# Patient Record
Sex: Female | Born: 1945 | Marital: Married | Smoking: Never smoker
Health system: Southern US, Community
[De-identification: ages and names within clinical notes are randomized; demographics above are authoritative.]

## PROBLEM LIST (undated history)

## (undated) DIAGNOSIS — G5600 Carpal tunnel syndrome, unspecified upper limb: Secondary | ICD-10-CM

## (undated) DIAGNOSIS — E119 Type 2 diabetes mellitus without complications: Secondary | ICD-10-CM

## (undated) DIAGNOSIS — I10 Essential (primary) hypertension: Secondary | ICD-10-CM

## (undated) DIAGNOSIS — I872 Venous insufficiency (chronic) (peripheral): Secondary | ICD-10-CM

## (undated) DIAGNOSIS — E785 Hyperlipidemia, unspecified: Secondary | ICD-10-CM

## (undated) DIAGNOSIS — I5A Non-ischemic myocardial injury (non-traumatic): Secondary | ICD-10-CM

## (undated) DIAGNOSIS — M1990 Unspecified osteoarthritis, unspecified site: Secondary | ICD-10-CM

## (undated) DIAGNOSIS — E039 Hypothyroidism, unspecified: Secondary | ICD-10-CM

## (undated) HISTORY — PX: OTHER SURGICAL HISTORY: SHX169

---

## 2006-05-11 ENCOUNTER — Ambulatory Visit: Payer: Self-pay

## 2006-05-20 ENCOUNTER — Ambulatory Visit: Payer: Self-pay

## 2006-11-11 ENCOUNTER — Ambulatory Visit: Payer: Self-pay

## 2008-02-13 ENCOUNTER — Ambulatory Visit: Payer: Self-pay

## 2010-12-10 ENCOUNTER — Ambulatory Visit: Payer: Self-pay | Admitting: Family Medicine

## 2012-03-27 ENCOUNTER — Ambulatory Visit: Payer: Self-pay

## 2012-08-16 ENCOUNTER — Ambulatory Visit: Payer: Self-pay | Admitting: Family Medicine

## 2013-08-22 ENCOUNTER — Ambulatory Visit: Payer: Self-pay | Admitting: Family Medicine

## 2014-03-08 DIAGNOSIS — M7989 Other specified soft tissue disorders: Secondary | ICD-10-CM | POA: Insufficient documentation

## 2015-07-16 ENCOUNTER — Other Ambulatory Visit: Payer: Self-pay | Admitting: Family Medicine

## 2015-07-16 DIAGNOSIS — Z1231 Encounter for screening mammogram for malignant neoplasm of breast: Secondary | ICD-10-CM

## 2015-07-30 ENCOUNTER — Other Ambulatory Visit: Payer: Self-pay | Admitting: Family Medicine

## 2015-07-30 ENCOUNTER — Ambulatory Visit
Admission: RE | Admit: 2015-07-30 | Discharge: 2015-07-30 | Disposition: A | Discharge status: Home / Self Care | Payer: Self-pay | Source: Ambulatory Visit | Attending: Family Medicine | Admitting: Family Medicine

## 2015-07-30 DIAGNOSIS — Z1231 Encounter for screening mammogram for malignant neoplasm of breast: Secondary | ICD-10-CM

## 2016-06-28 ENCOUNTER — Other Ambulatory Visit: Payer: Self-pay | Admitting: Family Medicine

## 2016-06-28 DIAGNOSIS — Z1231 Encounter for screening mammogram for malignant neoplasm of breast: Secondary | ICD-10-CM

## 2016-08-03 ENCOUNTER — Ambulatory Visit
Admission: RE | Admit: 2016-08-03 | Discharge: 2016-08-03 | Disposition: A | Discharge status: Home / Self Care | Payer: Self-pay | Source: Ambulatory Visit | Attending: Family Medicine | Admitting: Family Medicine

## 2016-08-03 DIAGNOSIS — Z1231 Encounter for screening mammogram for malignant neoplasm of breast: Secondary | ICD-10-CM

## 2018-05-10 DIAGNOSIS — R29898 Other symptoms and signs involving the musculoskeletal system: Secondary | ICD-10-CM | POA: Insufficient documentation

## 2018-05-10 DIAGNOSIS — R200 Anesthesia of skin: Secondary | ICD-10-CM | POA: Insufficient documentation

## 2018-05-10 DIAGNOSIS — M79601 Pain in right arm: Secondary | ICD-10-CM | POA: Insufficient documentation

## 2019-05-04 DIAGNOSIS — M1710 Unilateral primary osteoarthritis, unspecified knee: Secondary | ICD-10-CM | POA: Insufficient documentation

## 2019-05-04 DIAGNOSIS — M179 Osteoarthritis of knee, unspecified: Secondary | ICD-10-CM | POA: Insufficient documentation

## 2019-05-04 DIAGNOSIS — M545 Low back pain, unspecified: Secondary | ICD-10-CM | POA: Insufficient documentation

## 2019-06-23 ENCOUNTER — Ambulatory Visit: Payer: Medicare Other | Attending: Internal Medicine

## 2019-06-23 DIAGNOSIS — Z23 Encounter for immunization: Secondary | ICD-10-CM

## 2019-06-23 NOTE — Progress Notes (Signed)
   Covid-19 Vaccination Clinic  Name:  Gigi Onstad    MRN: [id number] DOB: 05-20-1945  06/23/2019  Ms. Moser was observed post Covid-19 immunization for 15 minutes without incident. She was provided with Vaccine Information Sheet and instruction to access the V-Safe system.   Ms. Tinch was instructed to call 911 with any severe reactions post vaccine: Marland Kitchen Difficulty breathing  . Swelling of face and throat  . A fast heartbeat  . A bad rash all over body  . Dizziness and weakness   Immunizations Administered    Name Date Dose VIS Date Route   Pfizer COVID-19 Vaccine 06/23/2019  8:46 AM 0.3 mL 03/09/2019 Intramuscular   Manufacturer: [employer], [employer]   Lot: [id number]   NDC: [address]-3

## 2019-07-14 ENCOUNTER — Ambulatory Visit: Payer: Medicare Other | Attending: Internal Medicine

## 2019-07-14 DIAGNOSIS — Z23 Encounter for immunization: Secondary | ICD-10-CM

## 2019-07-14 NOTE — Progress Notes (Signed)
   Covid-19 Vaccination Clinic  Name:  Kelsey Wise    MRN: [id number] DOB: 1945-12-23  07/14/2019  [name] was observed post Covid-19 immunization for 15 minutes without incident. She was provided with Vaccine Information Sheet and instruction to access the V-Safe system.   [name] was instructed to call 911 with any severe reactions post vaccine: Marland Kitchen Difficulty breathing  . Swelling of face and throat  . A fast heartbeat  . A bad rash all over body  . Dizziness and weakness   Immunizations Administered    Name Date Dose VIS Date Route   Pfizer COVID-19 Vaccine 07/14/2019  8:20 AM 0.3 mL 03/09/2019 Intramuscular   Manufacturer: [employer], [employer]   Lot: [id number]   NDC: [address]-1

## 2019-09-11 ENCOUNTER — Other Ambulatory Visit: Payer: Self-pay | Admitting: Family Medicine

## 2019-09-11 DIAGNOSIS — Z1231 Encounter for screening mammogram for malignant neoplasm of breast: Secondary | ICD-10-CM

## 2020-10-27 ENCOUNTER — Other Ambulatory Visit: Payer: Self-pay | Admitting: Family Medicine

## 2020-10-27 DIAGNOSIS — Z1231 Encounter for screening mammogram for malignant neoplasm of breast: Secondary | ICD-10-CM

## 2020-12-08 ENCOUNTER — Ambulatory Visit
Admission: RE | Admit: 2020-12-08 | Discharge: 2020-12-08 | Disposition: A | Discharge status: Home / Self Care | Payer: Medicare Other | Source: Ambulatory Visit | Attending: Family Medicine | Admitting: Family Medicine

## 2020-12-08 ENCOUNTER — Other Ambulatory Visit: Payer: Self-pay

## 2020-12-08 DIAGNOSIS — Z1231 Encounter for screening mammogram for malignant neoplasm of breast: Secondary | ICD-10-CM | POA: Insufficient documentation

## 2020-12-12 ENCOUNTER — Other Ambulatory Visit (INDEPENDENT_AMBULATORY_CARE_PROVIDER_SITE_OTHER): Payer: Self-pay | Admitting: Vascular Surgery

## 2020-12-12 DIAGNOSIS — M79605 Pain in left leg: Secondary | ICD-10-CM

## 2020-12-15 ENCOUNTER — Other Ambulatory Visit: Payer: Self-pay

## 2020-12-15 ENCOUNTER — Ambulatory Visit (INDEPENDENT_AMBULATORY_CARE_PROVIDER_SITE_OTHER): Payer: Medicare Other | Admitting: Nurse Practitioner

## 2020-12-15 ENCOUNTER — Encounter (INDEPENDENT_AMBULATORY_CARE_PROVIDER_SITE_OTHER): Payer: Self-pay | Admitting: Nurse Practitioner

## 2020-12-15 ENCOUNTER — Ambulatory Visit (INDEPENDENT_AMBULATORY_CARE_PROVIDER_SITE_OTHER): Payer: Medicare Other

## 2020-12-15 VITALS — BP 142/78 | HR 99 | Resp 17 | Ht <= 58 in | Wt 216.0 lb

## 2020-12-15 DIAGNOSIS — M1711 Unilateral primary osteoarthritis, right knee: Secondary | ICD-10-CM | POA: Diagnosis not present

## 2020-12-15 DIAGNOSIS — M79605 Pain in left leg: Secondary | ICD-10-CM

## 2020-12-15 NOTE — Progress Notes (Signed)
Subjective:    Patient ID: Kelsey Wise, female    DOB: [birth year], 75 y.o.   MRN: [id number] Chief Complaint  Patient presents with   New Patient (Initial Visit)    NP referred  by Dr. Hillery Aldo LLE pain worse W/ Ambulation     Kelsey Wise is a 75 year old female that is referred by Dr. Allena Katz in regards to concern for possible peripheral arterial disease.  The patient has left lower extremity pain that tends to be worse when she is ambulating.  She notes that the pain radiates from her knee up to her hip.  She denies any rest pain like symptoms.  She denies any wounds or ulcerations.  The patient does have a history of significant arthritis in several tender areas including her knees and her shoulders.  Today noninvasive studies show an ABI of 1.14 on the right and 1.16 on the left.  The patient has a TBI 0.92 on the right and 0.88 on the left.  Patient has triphasic tibial artery waveforms bilaterally with good toe waveforms bilaterally.   Review of Systems  Musculoskeletal:  Positive for arthralgias and gait problem.  All other systems reviewed and are negative.     Objective:   Physical Exam Vitals reviewed.  HENT:     Head: Normocephalic.  Cardiovascular:     Rate and Rhythm: Normal rate.     Pulses:          Dorsalis pedis pulses are 2+ on the right side and 1+ on the left side.       Posterior tibial pulses are 1+ on the right side and 2+ on the left side.  Pulmonary:     Effort: Pulmonary effort is normal.  Musculoskeletal:     Right lower leg: 1+ Edema present.     Left lower leg: 1+ Edema present.  Skin:    Capillary Refill: Capillary refill takes less than 2 seconds.  Neurological:     Mental Status: She is alert and oriented to person, place, and time.     Gait: Gait abnormal.  Psychiatric:        Mood and Affect: Mood normal.        Behavior: Behavior normal.        Thought Content: Thought content normal.        Judgment: Judgment normal.    BP (!)  142/78   Pulse 99   Resp 17   Ht 4\' 10"  (1.473 m)   Wt 216 lb (98 kg)   BMI 45.14 kg/m   History reviewed. No pertinent past medical history.  Social History   Socioeconomic History   Marital status: Married    Spouse name: Not on file   Number of children: Not on file   Years of education: Not on file   Highest education level: Not on file  Occupational History   Not on file  Tobacco Use   Smoking status: Never   Smokeless tobacco: Never  Substance and Sexual Activity   Alcohol use: Not on file   Drug use: Not on file   Sexual activity: Not on file  Other Topics Concern   Not on file  Social History Narrative   Not on file   Social Determinants of Health   Financial Resource Strain: Not on file  Food Insecurity: Not on file  Transportation Needs: Not on file  Physical Activity: Not on file  Stress: Not on file  Social Connections: Not on file  Intimate Partner Violence: Not on file    History reviewed. No pertinent surgical history.  Family History  Problem Relation Age of Onset   Breast cancer Neg Hx     Not on File  No flowsheet data found.    CMP  No results found for: NA, K, CL, CO2, GLUCOSE, BUN, CREATININE, CALCIUM, PROT, ALBUMIN, AST, ALT, ALKPHOS, BILITOT, GFRNONAA, GFRAA   No results found.     Assessment & Plan:    1. Left leg pain Recommend:  I do not find evidence of Vascular pathology that would explain the patient's symptoms  The patient has atypical pain symptoms for vascular disease  I do not find evidence of Vascular pathology that would explain the patient's symptoms and I suspect the patient is c/o pseudoclaudication.  Patient should have an evaluation of his LS spine which I defer to the primary service.  Noninvasive studies including ABIs the legs do not identify vascular problems  The patient should continue walking and begin a more formal exercise program. The patient should continue his antiplatelet therapy and  aggressive treatment of the lipid abnormalities. The patient should begin wearing graduated compression socks 15-20 mmHg strength to control her mild edema.  Patient will follow-up with me on a PRN basis  Further work-up of her lower extremity pain is deferred to the primary service      2. Osteoarthritis of right knee, unspecified osteoarthritis type Continue NSAID medications as already ordered, these medications have been reviewed and there are no changes at this time.  Continued activity and therapy was stressed.    Current Outpatient Medications on File Prior to Visit  Medication Sig Dispense Refill   Multiple Vitamins-Minerals (MULTIVITAMIN WITH MINERALS) tablet Take 1 tablet by mouth daily.     acetaminophen (TYLENOL) 325 MG tablet Take by mouth.     aspirin 81 MG chewable tablet Chew 1 tablet by mouth daily.     atorvastatin (LIPITOR) 80 MG tablet Take 1 tablet by mouth daily.     buPROPion (WELLBUTRIN SR) 150 MG 12 hr tablet Take 1 tablet by mouth daily.     gabapentin (NEURONTIN) 600 MG tablet Take 1 tablet by mouth 2 (two) times daily.     Garlic 1000 MG CAPS Take 1 capsule by mouth daily.     levothyroxine (SYNTHROID) 50 MCG tablet Take 1 tablet by mouth daily.     loratadine (CLARITIN) 10 MG tablet Take 1 tablet by mouth daily.     meloxicam (MOBIC) 15 MG tablet Take 15 mg by mouth daily.     olopatadine (PATANOL) 0.1 % ophthalmic solution SMARTSIG:In Eye(s)     triamterene-hydrochlorothiazide (MAXZIDE-25) 37.5-25 MG tablet Take 1 tablet by mouth daily.     No current facility-administered medications on file prior to visit.    There are no Patient Instructions on file for this visit. No follow-ups on file.   Georgiana Spinner, NP

## 2021-11-03 ENCOUNTER — Other Ambulatory Visit: Payer: Self-pay | Admitting: Family Medicine

## 2021-11-03 DIAGNOSIS — Z1231 Encounter for screening mammogram for malignant neoplasm of breast: Secondary | ICD-10-CM

## 2021-12-09 ENCOUNTER — Ambulatory Visit
Admission: RE | Admit: 2021-12-09 | Discharge: 2021-12-09 | Disposition: A | Discharge status: Home / Self Care | Payer: Medicare Other | Source: Ambulatory Visit | Attending: Family Medicine | Admitting: Family Medicine

## 2021-12-09 DIAGNOSIS — Z1231 Encounter for screening mammogram for malignant neoplasm of breast: Secondary | ICD-10-CM | POA: Insufficient documentation

## 2022-11-23 IMAGING — MG MM DIGITAL SCREENING BILAT W/ TOMO AND CAD
6 of 12 series · 6 of 36 positions shown · non-contrast
Comparison: Previous exam(s).

CLINICAL DATA: Screening.

EXAM:
DIGITAL SCREENING BILATERAL MAMMOGRAM WITH TOMOSYNTHESIS AND CAD
TECHNIQUE: Bilateral screening digital craniocaudal and mediolateral oblique
mammograms were obtained. Bilateral screening digital breast
tomosynthesis was performed. The images were evaluated with
computer-aided detection.

[L MLO synth-2D (1 of 2)]
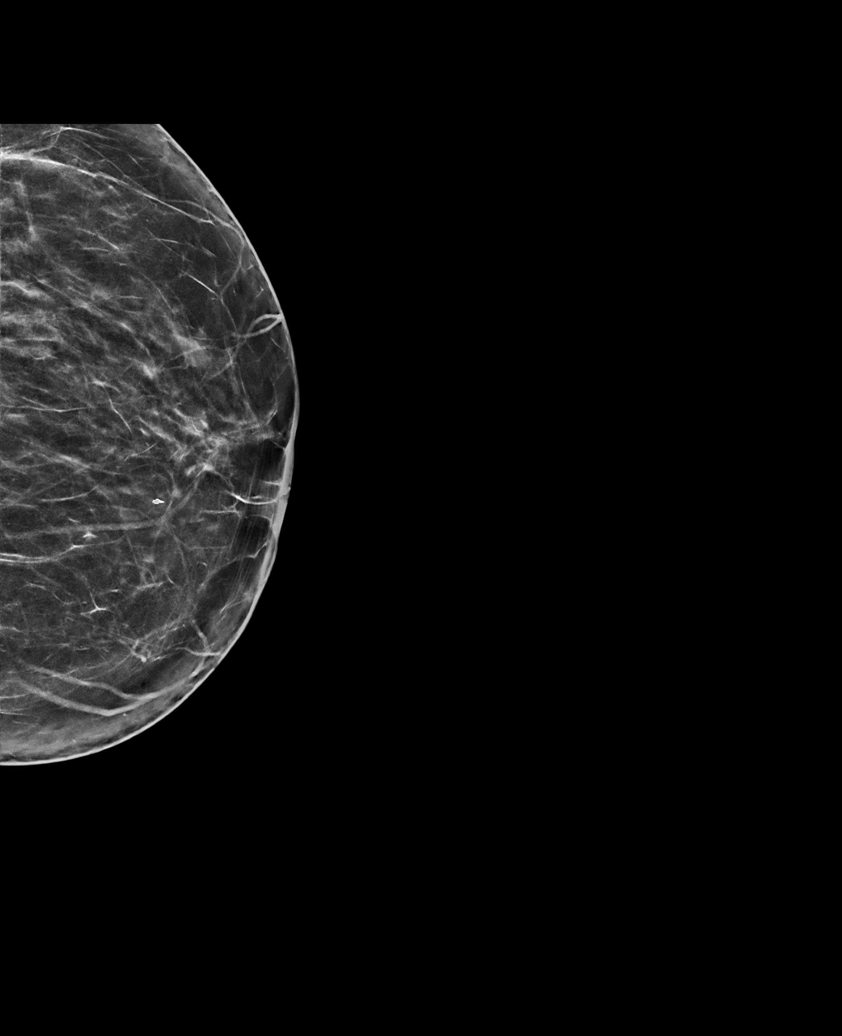

[L MLO synth-2D (2 of 2)]
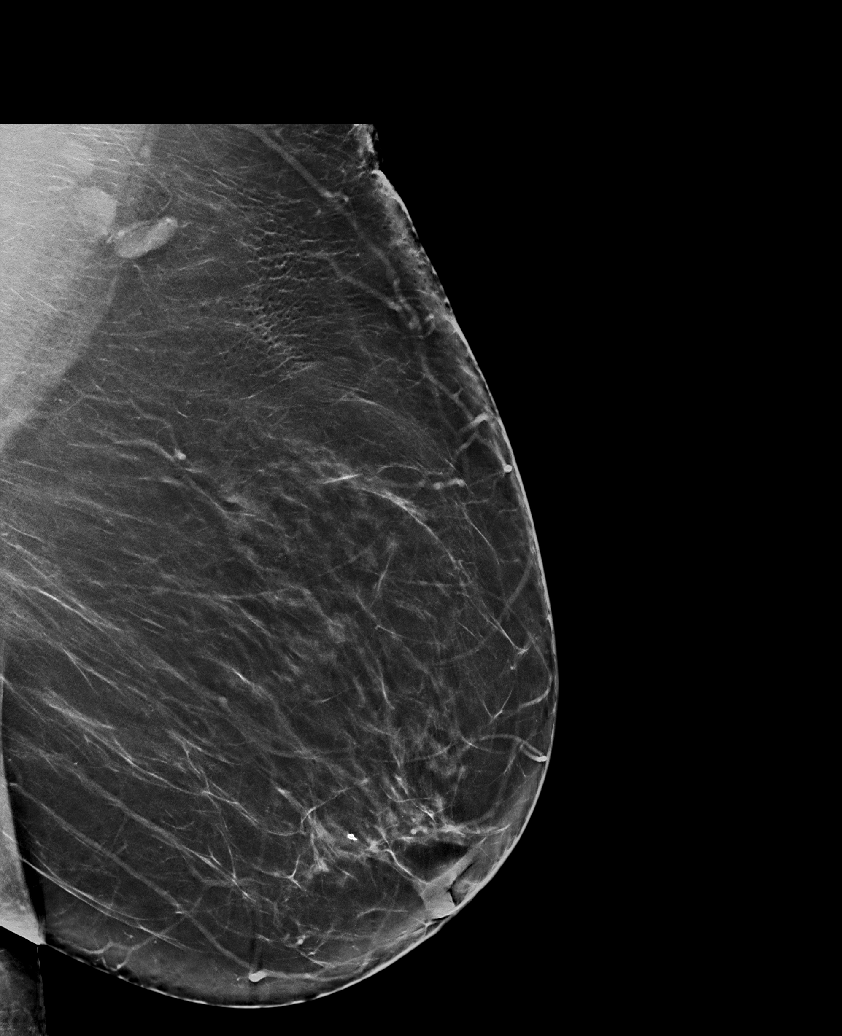

[L CC synth-2D]
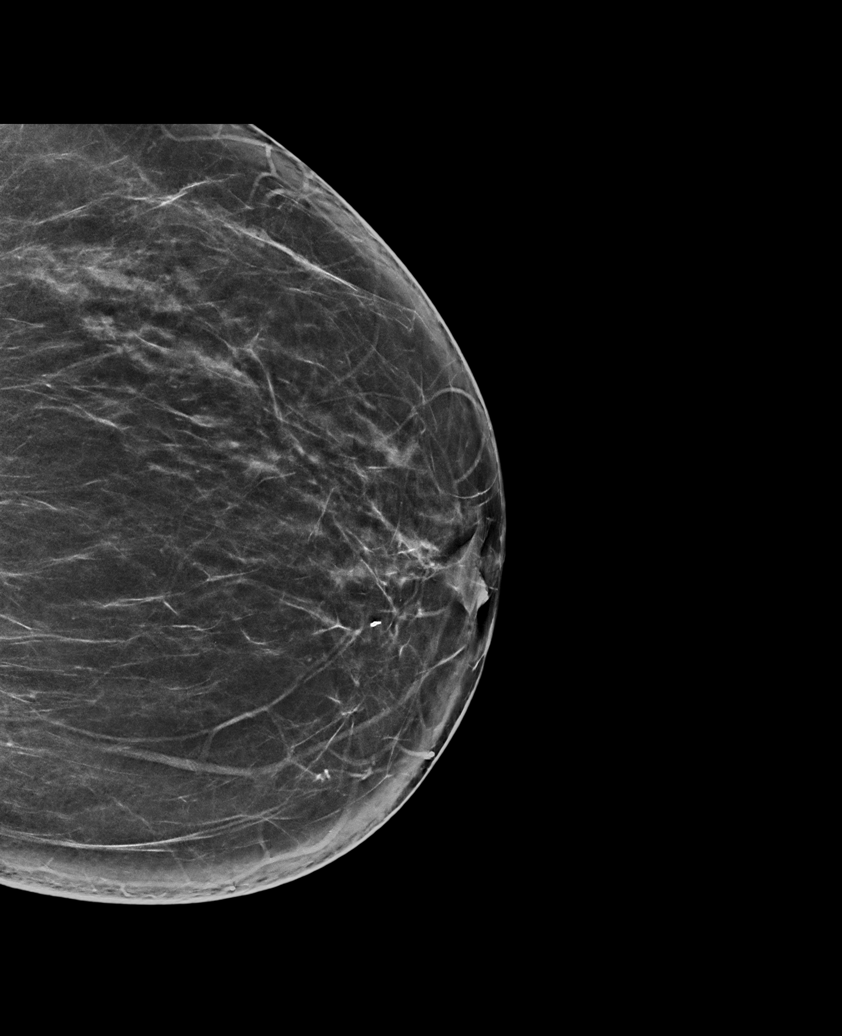

[R CC synth-2D]
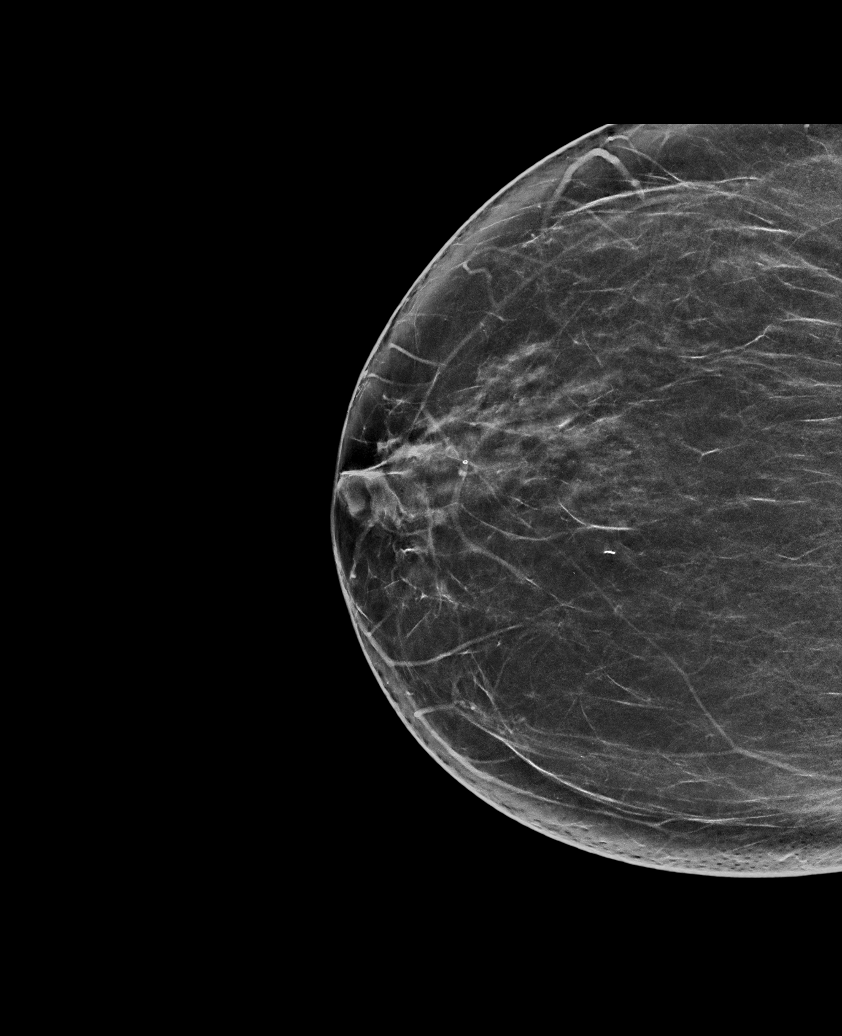

[R MLO synth-2D (1 of 2)]
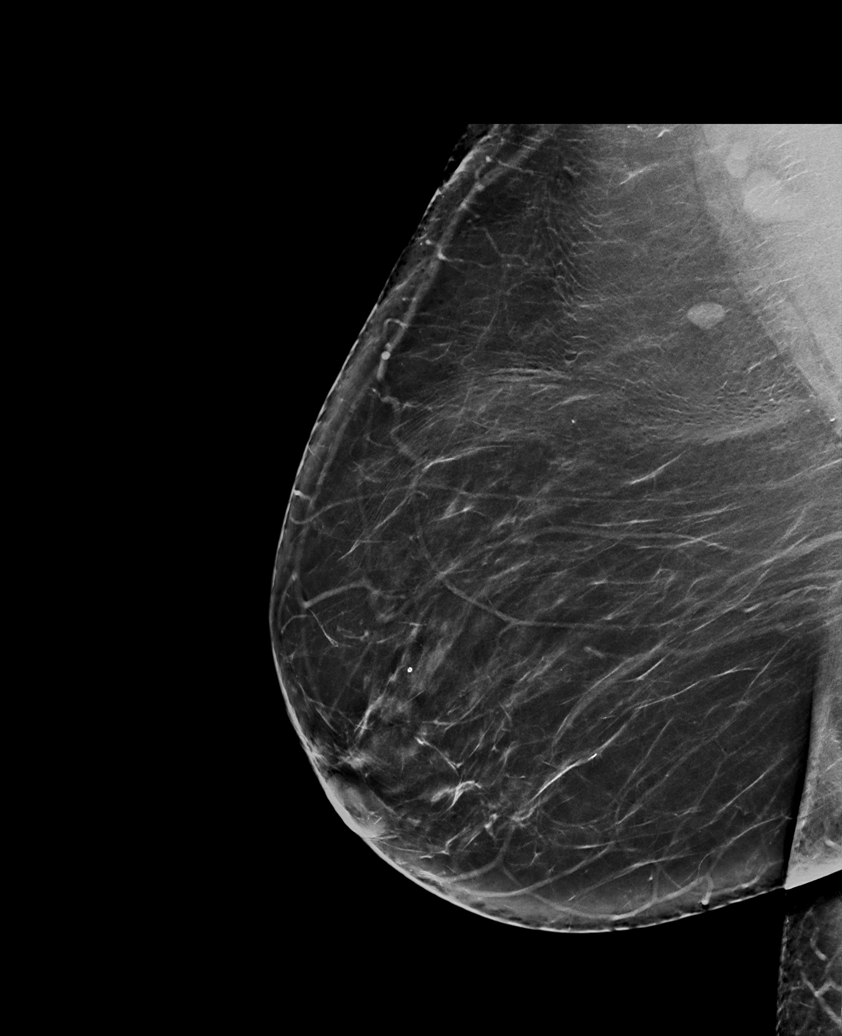

[R MLO synth-2D (2 of 2)]
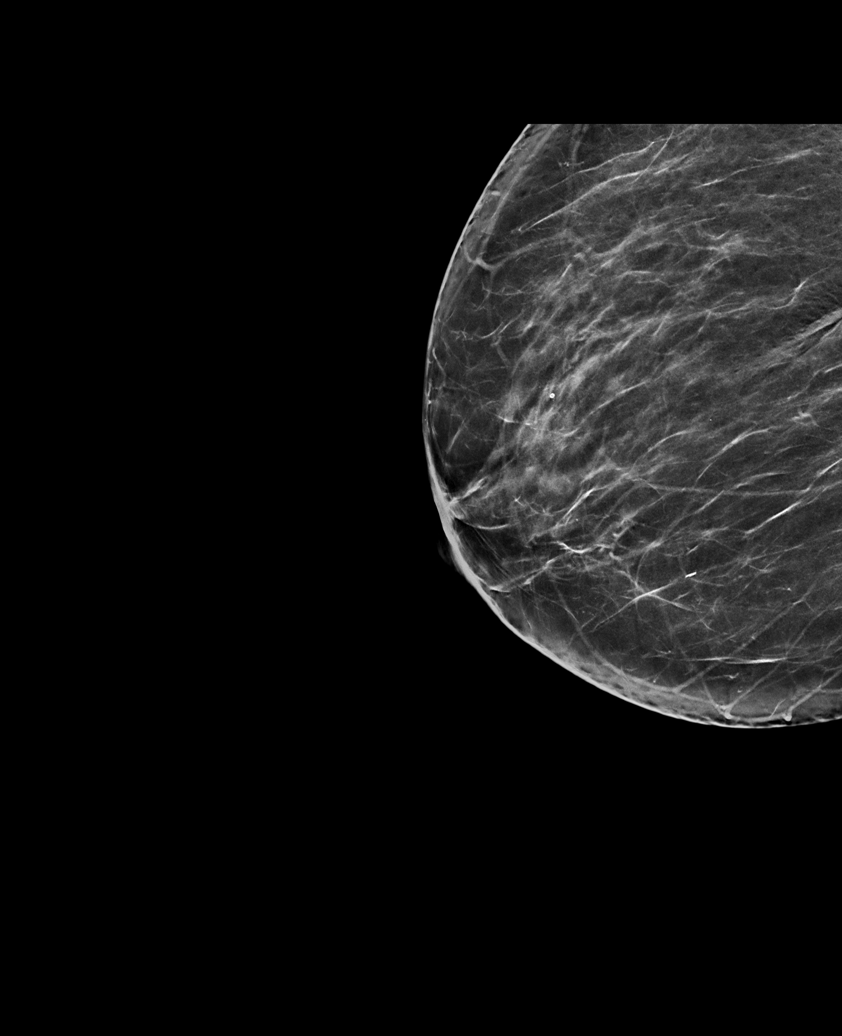

[6 of 36 positions shown; findings below may reference images not displayed]

ACR Breast Density Category b: There are scattered areas of
fibroglandular density.
FINDINGS: There are no findings suspicious for malignancy.
IMPRESSION: No mammographic evidence of malignancy. A result letter of this
screening mammogram will be mailed directly to the patient.

RECOMMENDATION:
Screening mammogram in one year. (Code:51-O-LD2)

BI-RADS CATEGORY  1: Negative.

## 2022-12-01 ENCOUNTER — Encounter: Payer: Self-pay | Admitting: Family Medicine

## 2023-01-11 ENCOUNTER — Encounter: Payer: Self-pay | Admitting: Obstetrics & Gynecology

## 2023-01-31 ENCOUNTER — Encounter: Payer: Self-pay | Admitting: Obstetrics & Gynecology

## 2023-01-31 ENCOUNTER — Ambulatory Visit (INDEPENDENT_AMBULATORY_CARE_PROVIDER_SITE_OTHER): Payer: Medicare Other | Admitting: Obstetrics & Gynecology

## 2023-01-31 VITALS — BP 117/77 | HR 77 | Ht <= 58 in | Wt 200.0 lb

## 2023-01-31 DIAGNOSIS — Z4689 Encounter for fitting and adjustment of other specified devices: Secondary | ICD-10-CM | POA: Diagnosis not present

## 2023-01-31 DIAGNOSIS — N814 Uterovaginal prolapse, unspecified: Secondary | ICD-10-CM

## 2023-01-31 DIAGNOSIS — Z758 Other problems related to medical facilities and other health care: Secondary | ICD-10-CM

## 2023-01-31 DIAGNOSIS — Z603 Acculturation difficulty: Secondary | ICD-10-CM | POA: Diagnosis not present

## 2023-01-31 DIAGNOSIS — Z7689 Persons encountering health services in other specified circumstances: Secondary | ICD-10-CM

## 2023-01-31 NOTE — Progress Notes (Signed)
    GYNECOLOGY PROGRESS NOTE  Subjective:    Patient ID: Kelsey Wise, female    DOB: 12-18-1945, 77 y.o.   MRN: [id number]  HPI  Patient is a 77 y.o. married Gre[place]-speaking [phone] (4 living children) here as a new patient with the issue of "my uterus dropping" for about 3-5 months.   The following portions of the patient's history were reviewed and updated as appropriate: allergies, current medications, past family history, past medical history, past social history, past surgical history, and problem list.  Review of Systems Pertinent items are noted in HPI.   Objective:   Blood pressure 117/77, pulse 77, height 4\' 10"  (1.473 m), weight 200 lb (90.7 kg). Body mass index is 41.8 kg/m. [place] interpretor via iPad present for exam  General appearance: alert Complete procedentia noted, moderate VVA Bimanual exam reveals no masses or pain I fitted tried several pessaries and eventually found that a #3 ring with support relieves her prolapse and does not cause her pain/discomfort.   Assessment:   1. Establishing care with new doctor, encounter for   2. Uterine prolapse   3. Language barrier      Plan:   1. Establishing care with new doctor, encounter for   2. Uterine prolapse - I will have a #3 ring with support ordered. When it arrives, her English-speaking son (also here today with her) will be notified and she will return for its placement.  3. Language barrier

## 2023-02-21 ENCOUNTER — Ambulatory Visit: Payer: Medicare Other | Admitting: Obstetrics & Gynecology

## 2023-02-21 ENCOUNTER — Encounter: Payer: Self-pay | Admitting: Obstetrics & Gynecology

## 2023-02-21 VITALS — BP 134/78 | HR 91 | Wt 195.4 lb

## 2023-02-21 DIAGNOSIS — Z1231 Encounter for screening mammogram for malignant neoplasm of breast: Secondary | ICD-10-CM

## 2023-02-21 DIAGNOSIS — N813 Complete uterovaginal prolapse: Secondary | ICD-10-CM | POA: Diagnosis not present

## 2023-02-21 DIAGNOSIS — N814 Uterovaginal prolapse, unspecified: Secondary | ICD-10-CM

## 2023-02-21 NOTE — Progress Notes (Signed)
    GYNECOLOGY PROGRESS NOTE  Subjective:    Patient ID: Kelsey Wise, female    DOB: [birth year], 77 y.o.   MRN: [id number]  HPI  Patient is a 77 y.o. married [id number] Greek-speaking lady here to have her new #3 ring pessary placed. I met her 3 weeks ago with the issue to POP.  She has complete procedentia.  The following portions of the patient's history were reviewed and updated as appropriate: allergies, current medications, past family history, past medical history, past social history, past surgical history, and problem list.  Review of Systems Pertinent items are noted in HPI.   Objective:  Well nourished, well hydrated White female, no apparent distress Video interpretor used for communication during this visit. She is ambulating and conversing normally. It works best for her to use the electric bed. I placed the new #3 ring with support in her vagina. It held her uterus up and did not cause her any discomfort initially. However, after she dressed and I left the room, she called me back because she was feeling it "low". I removed it.     Assessment:   Complete uterine prolapse Screening for breast cancer  Plan:   Will refer her to urogynecology for further management

## 2023-02-27 DIAGNOSIS — R6520 Severe sepsis without septic shock: Secondary | ICD-10-CM

## 2023-02-27 DIAGNOSIS — N179 Acute kidney failure, unspecified: Secondary | ICD-10-CM

## 2023-02-27 DIAGNOSIS — A419 Sepsis, unspecified organism: Secondary | ICD-10-CM

## 2023-02-27 HISTORY — DX: Severe sepsis without septic shock: R65.20

## 2023-02-27 HISTORY — DX: Acute kidney failure, unspecified: N17.9

## 2023-02-27 HISTORY — DX: Sepsis, unspecified organism: A41.9

## 2023-03-13 ENCOUNTER — Emergency Department: Payer: Medicare Other

## 2023-03-13 ENCOUNTER — Inpatient Hospital Stay
Admission: EM | Admit: 2023-03-13 | Discharge: 2023-03-17 | DRG: 854 | Disposition: A | Discharge status: Home / Self Care | Payer: Medicare Other | Attending: Internal Medicine | Admitting: Internal Medicine

## 2023-03-13 ENCOUNTER — Other Ambulatory Visit: Payer: Self-pay

## 2023-03-13 DIAGNOSIS — N136 Pyonephrosis: Secondary | ICD-10-CM | POA: Diagnosis present

## 2023-03-13 DIAGNOSIS — Z6841 Body Mass Index (BMI) 40.0 and over, adult: Secondary | ICD-10-CM

## 2023-03-13 DIAGNOSIS — Z791 Long term (current) use of non-steroidal anti-inflammatories (NSAID): Secondary | ICD-10-CM | POA: Diagnosis not present

## 2023-03-13 DIAGNOSIS — Z79899 Other long term (current) drug therapy: Secondary | ICD-10-CM

## 2023-03-13 DIAGNOSIS — A419 Sepsis, unspecified organism: Secondary | ICD-10-CM | POA: Diagnosis not present

## 2023-03-13 DIAGNOSIS — I10 Essential (primary) hypertension: Secondary | ICD-10-CM | POA: Diagnosis present

## 2023-03-13 DIAGNOSIS — I2489 Other forms of acute ischemic heart disease: Secondary | ICD-10-CM | POA: Diagnosis present

## 2023-03-13 DIAGNOSIS — E039 Hypothyroidism, unspecified: Secondary | ICD-10-CM | POA: Diagnosis present

## 2023-03-13 DIAGNOSIS — N3000 Acute cystitis without hematuria: Secondary | ICD-10-CM | POA: Diagnosis not present

## 2023-03-13 DIAGNOSIS — G5600 Carpal tunnel syndrome, unspecified upper limb: Secondary | ICD-10-CM | POA: Insufficient documentation

## 2023-03-13 DIAGNOSIS — Z79890 Hormone replacement therapy (postmenopausal): Secondary | ICD-10-CM | POA: Diagnosis not present

## 2023-03-13 DIAGNOSIS — N179 Acute kidney failure, unspecified: Secondary | ICD-10-CM | POA: Diagnosis present

## 2023-03-13 DIAGNOSIS — E785 Hyperlipidemia, unspecified: Secondary | ICD-10-CM | POA: Diagnosis present

## 2023-03-13 DIAGNOSIS — E11649 Type 2 diabetes mellitus with hypoglycemia without coma: Secondary | ICD-10-CM | POA: Diagnosis not present

## 2023-03-13 DIAGNOSIS — D696 Thrombocytopenia, unspecified: Secondary | ICD-10-CM | POA: Diagnosis present

## 2023-03-13 DIAGNOSIS — Z7982 Long term (current) use of aspirin: Secondary | ICD-10-CM

## 2023-03-13 DIAGNOSIS — Z7984 Long term (current) use of oral hypoglycemic drugs: Secondary | ICD-10-CM

## 2023-03-13 DIAGNOSIS — Z603 Acculturation difficulty: Secondary | ICD-10-CM | POA: Diagnosis present

## 2023-03-13 DIAGNOSIS — B964 Proteus (mirabilis) (morganii) as the cause of diseases classified elsewhere: Secondary | ICD-10-CM | POA: Diagnosis present

## 2023-03-13 DIAGNOSIS — A4159 Other Gram-negative sepsis: Principal | ICD-10-CM | POA: Diagnosis present

## 2023-03-13 DIAGNOSIS — Z8249 Family history of ischemic heart disease and other diseases of the circulatory system: Secondary | ICD-10-CM

## 2023-03-13 DIAGNOSIS — E1165 Type 2 diabetes mellitus with hyperglycemia: Secondary | ICD-10-CM | POA: Diagnosis present

## 2023-03-13 DIAGNOSIS — R7989 Other specified abnormal findings of blood chemistry: Secondary | ICD-10-CM | POA: Diagnosis present

## 2023-03-13 DIAGNOSIS — N390 Urinary tract infection, site not specified: Secondary | ICD-10-CM | POA: Diagnosis present

## 2023-03-13 DIAGNOSIS — E876 Hypokalemia: Secondary | ICD-10-CM | POA: Diagnosis present

## 2023-03-13 DIAGNOSIS — I5A Non-ischemic myocardial injury (non-traumatic): Secondary | ICD-10-CM | POA: Diagnosis present

## 2023-03-13 DIAGNOSIS — R531 Weakness: Secondary | ICD-10-CM

## 2023-03-13 DIAGNOSIS — R7881 Bacteremia: Secondary | ICD-10-CM | POA: Diagnosis present

## 2023-03-13 DIAGNOSIS — R6520 Severe sepsis without septic shock: Secondary | ICD-10-CM | POA: Diagnosis present

## 2023-03-13 DIAGNOSIS — E871 Hypo-osmolality and hyponatremia: Secondary | ICD-10-CM | POA: Diagnosis present

## 2023-03-13 DIAGNOSIS — R7303 Prediabetes: Secondary | ICD-10-CM | POA: Diagnosis present

## 2023-03-13 DIAGNOSIS — I872 Venous insufficiency (chronic) (peripheral): Secondary | ICD-10-CM | POA: Insufficient documentation

## 2023-03-13 HISTORY — DX: Carpal tunnel syndrome, unspecified upper limb: G56.00

## 2023-03-13 HISTORY — DX: Hyperlipidemia, unspecified: E78.5

## 2023-03-13 HISTORY — DX: Venous insufficiency (chronic) (peripheral): I87.2

## 2023-03-13 HISTORY — DX: Hypothyroidism, unspecified: E03.9

## 2023-03-13 LAB — CBC
HCT: 39.9 % (ref 36.0–46.0)
Hemoglobin: 13.3 g/dL (ref 12.0–15.0)
MCH: 29.8 pg (ref 26.0–34.0)
MCHC: 33.3 g/dL (ref 30.0–36.0)
MCV: 89.5 fL (ref 80.0–100.0)
Platelets: 141 10*3/uL — ABNORMAL LOW (ref 150–400)
RBC: 4.46 MIL/uL (ref 3.87–5.11)
RDW: 13.4 % (ref 11.5–15.5)
WBC: 19.9 10*3/uL — ABNORMAL HIGH (ref 4.0–10.5)
nRBC: 0 % (ref 0.0–0.2)

## 2023-03-13 LAB — COMPREHENSIVE METABOLIC PANEL
ALT: 36 U/L (ref 0–44)
AST: 49 U/L — ABNORMAL HIGH (ref 15–41)
Albumin: 3.2 g/dL — ABNORMAL LOW (ref 3.5–5.0)
Alkaline Phosphatase: 140 U/L — ABNORMAL HIGH (ref 38–126)
Anion gap: 15 (ref 5–15)
BUN: 65 mg/dL — ABNORMAL HIGH (ref 8–23)
CO2: 23 mmol/L (ref 22–32)
Calcium: 8.4 mg/dL — ABNORMAL LOW (ref 8.9–10.3)
Chloride: 92 mmol/L — ABNORMAL LOW (ref 98–111)
Creatinine, Ser: 1.94 mg/dL — ABNORMAL HIGH (ref 0.44–1.00)
GFR, Estimated: 26 mL/min — ABNORMAL LOW (ref >60)
Glucose, Bld: 370 mg/dL — ABNORMAL HIGH (ref 70–99)
Potassium: 3.3 mmol/L — ABNORMAL LOW (ref 3.5–5.1)
Sodium: 130 mmol/L — ABNORMAL LOW (ref 135–145)
Total Bilirubin: 1.9 mg/dL — ABNORMAL HIGH (ref <1.2)
Total Protein: 7.6 g/dL (ref 6.5–8.1)

## 2023-03-13 LAB — URINALYSIS, ROUTINE W REFLEX MICROSCOPIC
Bacteria, UA: NONE SEEN
Bilirubin Urine: NEGATIVE
Glucose, UA: >=500 mg/dL — AB
Ketones, ur: NEGATIVE mg/dL
Nitrite: NEGATIVE
Protein, ur: 100 mg/dL — AB
Specific Gravity, Urine: 1.018 (ref 1.005–1.030)
Squamous Epithelial / HPF: 0 /[HPF] (ref 0–5)
WBC, UA: >50 WBC/hpf (ref 0–5)
pH: 8 (ref 5.0–8.0)

## 2023-03-13 LAB — BLOOD GAS, VENOUS
Acid-Base Excess: 0.3 mmol/L (ref 0.0–2.0)
Bicarbonate: 24.6 mmol/L (ref 20.0–28.0)
O2 Saturation: 59.4 %
Patient temperature: 37
pCO2, Ven: 38 mm[Hg] — ABNORMAL LOW (ref 44–60)
pH, Ven: 7.42 (ref 7.25–7.43)
pO2, Ven: 35 mm[Hg] (ref 32–45)

## 2023-03-13 LAB — PROTIME-INR
INR: 1.2 (ref 0.8–1.2)
Prothrombin Time: 15.2 s (ref 11.4–15.2)

## 2023-03-13 LAB — LACTIC ACID, PLASMA: Lactic Acid, Venous: 3.5 mmol/L (ref 0.5–1.9)

## 2023-03-13 LAB — CBG MONITORING, ED: Glucose-Capillary: 321 mg/dL — ABNORMAL HIGH (ref 70–99)

## 2023-03-13 LAB — TROPONIN I (HIGH SENSITIVITY): Troponin I (High Sensitivity): 127 ng/L (ref <18)

## 2023-03-13 LAB — MAGNESIUM: Magnesium: 2.1 mg/dL (ref 1.7–2.4)

## 2023-03-13 LAB — APTT: aPTT: 36 s (ref 24–36)

## 2023-03-13 MED ORDER — HEPARIN SODIUM (PORCINE) 5000 UNIT/ML IJ SOLN
5000.0000 [IU] | Freq: Three times a day (TID) | INTRAMUSCULAR | Status: DC
  Administered 2023-03-14 – 2023-03-17 (×11): 5000 [IU] via SUBCUTANEOUS
  Filled 2023-03-13 (×10): qty 1

## 2023-03-13 MED ORDER — SODIUM CHLORIDE 0.9 % IV SOLN
INTRAVENOUS | Status: AC
Start: 2023-03-13 — End: 2023-03-14

## 2023-03-13 MED ORDER — INSULIN ASPART 100 UNIT/ML IJ SOLN
0.0000 [IU] | Freq: Every day | INTRAMUSCULAR | Status: DC
  Administered 2023-03-14: 3 [IU] via SUBCUTANEOUS
  Administered 2023-03-14: 2 [IU] via SUBCUTANEOUS
  Filled 2023-03-13 (×2): qty 1

## 2023-03-13 MED ORDER — HYDRALAZINE HCL 20 MG/ML IJ SOLN: 5.0000 mg | INTRAMUSCULAR | Status: DC | PRN

## 2023-03-13 MED ORDER — ASPIRIN 81 MG PO TBEC
81.0000 mg | DELAYED_RELEASE_TABLET | Freq: Every day | ORAL | Status: DC
  Administered 2023-03-14 – 2023-03-17 (×5): 81 mg via ORAL
  Filled 2023-03-13 (×5): qty 1

## 2023-03-13 MED ORDER — ACETAMINOPHEN 325 MG PO TABS
325.0000 mg | ORAL_TABLET | Freq: Four times a day (QID) | ORAL | Status: DC | PRN
  Filled 2023-03-13: qty 1

## 2023-03-13 MED ORDER — SODIUM CHLORIDE 0.9 % IV BOLUS
1000.0000 mL | Freq: Once | INTRAVENOUS | Status: AC
  Administered 2023-03-14: 1000 mL via INTRAVENOUS

## 2023-03-13 MED ORDER — METRONIDAZOLE 500 MG/100ML IV SOLN
500.0000 mg | Freq: Once | INTRAVENOUS | Status: AC
  Administered 2023-03-13: 500 mg via INTRAVENOUS
  Filled 2023-03-13: qty 100

## 2023-03-13 MED ORDER — ONDANSETRON HCL 4 MG/2ML IJ SOLN: 4.0000 mg | Freq: Three times a day (TID) | INTRAMUSCULAR | Status: DC | PRN

## 2023-03-13 MED ORDER — SODIUM CHLORIDE 0.9 % IV SOLN
2.0000 g | Freq: Once | INTRAVENOUS | Status: AC
  Administered 2023-03-13: 2 g via INTRAVENOUS
  Filled 2023-03-13: qty 12.5

## 2023-03-13 MED ORDER — INSULIN ASPART 100 UNIT/ML IJ SOLN
0.0000 [IU] | Freq: Three times a day (TID) | INTRAMUSCULAR | Status: DC
  Administered 2023-03-14 – 2023-03-15 (×4): 2 [IU] via SUBCUTANEOUS
  Administered 2023-03-16: 1 [IU] via SUBCUTANEOUS
  Administered 2023-03-17: 3 [IU] via SUBCUTANEOUS
  Administered 2023-03-17: 1 [IU] via SUBCUTANEOUS
  Filled 2023-03-13 (×8): qty 1

## 2023-03-13 MED ORDER — LACTATED RINGERS IV BOLUS
1000.0000 mL | Freq: Once | INTRAVENOUS | Status: AC
  Administered 2023-03-13: 1000 mL via INTRAVENOUS

## 2023-03-13 MED ORDER — POTASSIUM CHLORIDE CRYS ER 20 MEQ PO TBCR
40.0000 meq | EXTENDED_RELEASE_TABLET | Freq: Once | ORAL | Status: AC
  Administered 2023-03-14: 40 meq via ORAL
  Filled 2023-03-13: qty 2

## 2023-03-13 MED ORDER — VANCOMYCIN HCL IN DEXTROSE 1-5 GM/200ML-% IV SOLN
1000.0000 mg | Freq: Once | INTRAVENOUS | Status: AC
Start: 2023-03-13 — End: 2023-03-14
  Administered 2023-03-13: 1000 mg via INTRAVENOUS
  Filled 2023-03-13: qty 200

## 2023-03-13 MED ORDER — INSULIN GLARGINE-YFGN 100 UNIT/ML ~~LOC~~ SOLN
10.0000 [IU] | Freq: Every day | SUBCUTANEOUS | Status: DC
  Administered 2023-03-14 – 2023-03-16 (×4): 10 [IU] via SUBCUTANEOUS
  Filled 2023-03-13 (×4): qty 0.1

## 2023-03-13 MED ORDER — ASPIRIN 81 MG PO TBEC: 81.0000 mg | DELAYED_RELEASE_TABLET | Freq: Every day | ORAL | Status: DC

## 2023-03-13 NOTE — ED Provider Notes (Signed)
[facility] Provider Note    Event Date/Time   First MD Initiated Contact with Patient 03/13/23 2035     (approximate)   History   Chief Complaint Hyperglycemia   HPI  Kelsey Wise is a 77 y.o. female with past medical history of hyperlipidemia, diabetes, and hypothyroidism who presents to the ED complaining of hyperglycemia.  Majority of history is obtained from son at bedside given patient speaks only [place].  He states that the patient has been weak, fatigued, and generally ill for the past 2 days.  She has had subjective fevers and chills, but they have not taken her temperature at home.  Patient denies any cough, chest pain, shortness of breath, nausea, vomiting, diarrhea, abdominal pain, or dysuria.  They have been checking her blood sugar at home and have found it to be in the 200s or 300s despite her being compliant with her diabetic medications.  Family denies any history of DKA.     Physical Exam   Triage Vital Signs: ED Triage Vitals  Encounter Vitals Group     BP 03/13/23 2016 (!) 108/54     Systolic BP Percentile --      Diastolic BP Percentile --      Pulse Rate 03/13/23 2016 (!) 130     Resp 03/13/23 2016 20     Temp 03/13/23 2016 97.9 F (36.6 C)     Temp Source 03/13/23 2016 Oral     SpO2 03/13/23 2016 94 %     Weight --      Height --      Head Circumference --      Peak Flow --      Pain Score 03/13/23 2014 0     Pain Loc --      Pain Education --      Exclude from Growth Chart --     Most recent vital signs: Vitals:   03/13/23 2200 03/13/23 2300  BP: 119/67 113/60  Pulse: 94 87  Resp: 20 (!) 22  Temp:    SpO2: 100% 100%    Constitutional: Alert and oriented. Eyes: Conjunctivae are normal. Head: Atraumatic. Nose: No congestion/rhinnorhea. Mouth/Throat: Mucous membranes are dry. Cardiovascular: Tachycardic, regular rhythm. Grossly normal heart sounds.  2+ radial pulses bilaterally. Respiratory: Normal respiratory  effort.  No retractions. Lungs CTAB. Gastrointestinal: Soft and nontender. No distention. Musculoskeletal: No lower extremity tenderness nor edema.  Neurologic:  Normal speech and language. No gross focal neurologic deficits are appreciated.    ED Results / Procedures / Treatments   Labs (all labs ordered are listed, but only abnormal results are displayed) Labs Reviewed  CBC - Abnormal; Notable for the following components:      Result Value   WBC 19.9 (*)    Platelets 141 (*)    All other components within normal limits  COMPREHENSIVE METABOLIC PANEL - Abnormal; Notable for the following components:   Sodium 130 (*)    Potassium 3.3 (*)    Chloride 92 (*)    Glucose, Bld 370 (*)    BUN 65 (*)    Creatinine, Ser 1.94 (*)    Calcium 8.4 (*)    Albumin 3.2 (*)    AST 49 (*)    Alkaline Phosphatase 140 (*)    Total Bilirubin 1.9 (*)    GFR, Estimated 26 (*)    All other components within normal limits  LACTIC ACID, PLASMA - Abnormal; Notable for the following components:   Lactic  Acid, Venous 3.5 (*)    All other components within normal limits  BLOOD GAS, VENOUS - Abnormal; Notable for the following components:   pCO2, Ven 38 (*)    All other components within normal limits  CBG MONITORING, ED - Abnormal; Notable for the following components:   Glucose-Capillary 321 (*)    All other components within normal limits  TROPONIN I (HIGH SENSITIVITY) - Abnormal; Notable for the following components:   Troponin I (High Sensitivity) 127 (*)    All other components within normal limits  CULTURE, BLOOD (ROUTINE X 2)  CULTURE, BLOOD (ROUTINE X 2)  MAGNESIUM  URINALYSIS, ROUTINE W REFLEX MICROSCOPIC  LACTIC ACID, PLASMA  HEMOGLOBIN A1C  LIPID PANEL  PROCALCITONIN  PHOSPHORUS  COMPREHENSIVE METABOLIC PANEL  CBC  APTT  PROTIME-INR  CBG MONITORING, ED  CBG MONITORING, ED  TROPONIN I (HIGH SENSITIVITY)     EKG  ED ECG REPORT I, Chesley Noon, the attending physician,  personally viewed and interpreted this ECG.   Date: 03/13/2023  EKG Time: 20:20  Rate: 128  Rhythm: sinus tachycardia  Axis: LAD  Intervals:none  ST&T Change: None  RADIOLOGY Chest x-ray reviewed and interpreted by me with no infiltrate, edema, or effusion.  PROCEDURES:  Critical Care performed: Yes, see critical care procedure note(s)  .Critical Care  Performed by: Chesley Noon, MD Authorized by: Chesley Noon, MD   Critical care provider statement:    Critical care time (minutes):  30   Critical care time was exclusive of:  Separately billable procedures and treating other patients and teaching time   Critical care was necessary to treat or prevent imminent or life-threatening deterioration of the following conditions:  Sepsis   Critical care was time spent personally by me on the following activities:  Development of treatment plan with patient or surrogate, discussions with consultants, evaluation of patient's response to treatment, examination of patient, ordering and review of laboratory studies, ordering and review of radiographic studies, ordering and performing treatments and interventions, pulse oximetry, re-evaluation of patient's condition and review of old charts   I assumed direction of critical care for this patient from another provider in my specialty: no     Care discussed with: admitting provider      MEDICATIONS ORDERED IN ED: Medications  vancomycin (VANCOCIN) IVPB 1000 mg/200 mL premix (has no administration in time range)  potassium chloride SA (KLOR-CON M) CR tablet 40 mEq (has no administration in time range)  ondansetron (ZOFRAN) injection 4 mg (has no administration in time range)  hydrALAZINE (APRESOLINE) injection 5 mg (has no administration in time range)  acetaminophen (TYLENOL) tablet 325 mg (has no administration in time range)  insulin aspart (novoLOG) injection 0-9 Units (has no administration in time range)  insulin aspart (novoLOG)  injection 0-5 Units (has no administration in time range)  sodium chloride 0.9 % bolus 1,000 mL (has no administration in time range)  0.9 %  sodium chloride infusion (has no administration in time range)  aspirin EC tablet 81 mg (has no administration in time range)  heparin injection 5,000 Units (has no administration in time range)  lactated ringers bolus 1,000 mL (0 mLs Intravenous Stopped 03/13/23 2241)  ceFEPIme (MAXIPIME) 2 g in sodium chloride 0.9 % 100 mL IVPB (0 g Intravenous Stopped 03/13/23 2242)  metroNIDAZOLE (FLAGYL) IVPB 500 mg (500 mg Intravenous New Bag/Given 03/13/23 2207)  lactated ringers bolus 1,000 mL (1,000 mLs Intravenous New Bag/Given 03/13/23 2158)     IMPRESSION / MDM /  ASSESSMENT AND PLAN / ED COURSE  I reviewed the triage vital signs and the nursing notes.                              78 y.o. female with past medical history of hyperlipidemia, diabetes, and hypothyroidism who presents to the ED for generalized weakness and malaise for the past 2 days associate with subjective fevers and chills as well as hyperglycemia.  Patient's presentation is most consistent with acute presentation with potential threat to life or bodily function.  Differential diagnosis includes, but is not limited to, hyperglycemia, DKA, HHS, dehydration, electrolyte abnormality, AKI, sepsis, UTI, pneumonia.  Patient ill-appearing but in no acute distress, vital signs remarkable for tachycardia and borderline hypotension.  Patient noted to have significant leukocytosis at 19.9 and presentation concerning for sepsis, blood cultures drawn and initial lactic acid elevated, will recheck following IV fluid bolus.  Patient given 30 cc/kg per her ideal body weight, heart rate and blood pressure are improved on reassessment.  No evidence of DKA but she does have significant AKI without acute electrolyte abnormality.  Troponin elevated but I suspect this is due to sepsis.  Chest x-ray is unremarkable,  urinalysis pending at this time, but patient has a benign abdominal exam and I do not feel CT imaging warranted currently.  She was started on broad-spectrum antibiotics and case discussed with hospitalist for admission.      FINAL CLINICAL IMPRESSION(S) / ED DIAGNOSES   Final diagnoses:  Sepsis without acute organ dysfunction, due to unspecified organism (HCC)  AKI (acute kidney injury) (HCC)     Rx / DC Orders   ED Discharge Orders     None        Note:  This document was prepared using Dragon voice recognition software and may include unintentional dictation errors.   Chesley Noon, MD 03/13/23 2325

## 2023-03-13 NOTE — Progress Notes (Signed)
ED Pharmacy Antibiotic Sign Off An antibiotic consult was received from an ED provider for Cefepime and Vancomycin per pharmacy dosing for unknown source of infection. A chart review was completed to assess appropriateness.   The following one time order(s) were placed by ED provider:  Cefepime 2g and Vancomycin 1g  Further antibiotic and/or antibiotic pharmacy consults should be ordered by the admitting provider if indicated.   Thank you for allowing pharmacy to be a part of this patient's care.   Foye Deer, [facility]  Clinical Pharmacist 03/13/23 9:35 PM

## 2023-03-13 NOTE — ED Triage Notes (Addendum)
Pt to ed from home via POV for hyperglycemia. Pt is caox4, in no acute distress in triage. Pt lives with son who is at bedside. Pt states "she is pre-diabetic" but does take meds to control her diabetes. Pt is pale, cool and clammy in triage and appears very weak.

## 2023-03-13 NOTE — Progress Notes (Signed)
CODE SEPSIS - PHARMACY COMMUNICATION  **Broad Spectrum Antibiotics should be administered within 1 hour of Sepsis diagnosis**  Time Code Sepsis Called/Page Received: 2134  Antibiotics Ordered: Cefepime, Vancomycin, Flagyl  Time of 1st antibiotic administration: 2156  Otelia Sergeant, PharmD, [facility] 03/13/2023 9:54 PM

## 2023-03-13 NOTE — Sepsis Progress Note (Signed)
 Elink following this code sepsis.

## 2023-03-13 NOTE — H&P (Incomplete)
History and Physical    Kelsey Wise WUJ:[id number] DOB: 12-30-1945 DOA: 03/13/2023  Referring MD/NP/PA:   PCP: Hillery Aldo, MD   Patient coming from:  The patient is coming from home.     Chief Complaint: weakness, dysuria, lower abdominal pain  HPI: Kelsey Wise is a 77 y.o. female with medical history significant of HLD, pre-DM, hypothyroidism, carpal tunnel syndrome, back pain, arthritis, who presents with weakness, dysuria, lower abdominal pain.  Provide son at the bedside, patient has been sick in the past 4 days.  Patient has generalized weakness, dysuria, urinary frequency, lower abdominal pain, chills.  No fever.  Her lower abdominal pain is mild, aching, constant, nonradiating.  No chest pain, cough, SOB.  No nausea, vomiting, diarrhea.  Patient's blood pressure has been elevated at home  Data reviewed independently and ED Course: pt was found to have WBC 19.9, lactic acid 3.5, troponin 127, potassium 3.3, magnesium 2.1, AKI with creatinine 1.94, BUN 65, GFR 26 (no baseline creatinine available), mild abnormal liver function (ALP 140, AST 49, ALT 36, total bilirubin 1.9).  Chest x-ray showed cardiomegaly and atelectasis.  Patient is admitted to telemetry bed as inpatient.   EKG: I have personally reviewed.  Sinus rhythm, QTc 432, bilateral atrial enlargement, LAD, poor R progression, low voltage   Review of Systems:   General: no fevers, has chills, no body weight gain, has fatigue HEENT: no blurry vision, hearing changes or sore throat Respiratory: no dyspnea, coughing, wheezing CV: no chest pain, no palpitations GI: no nausea, vomiting, has lower abdominal pain, no diarrhea, constipation GU: has dysuria, increased urinary frequency, no hematuria  Ext: no leg edema Neuro: no unilateral weakness, numbness, or tingling, no vision change or hearing loss Skin: no rash, no skin tear. MSK: No muscle spasm, no deformity, no limitation of range of movement in spin Heme: No  easy bruising.  Travel history: No recent long distant travel.   Allergy: No Known Allergies  Past Medical History:  Diagnosis Date   Carpal tunnel syndrome    HLD (hyperlipidemia)    Hypothyroidism    Venous insufficiency     Past Surgical History:  Procedure Laterality Date   uterian tumor removal per patient      Social History:  reports that she has never smoked. She has never used smokeless tobacco. She reports that she does not currently use alcohol. She reports that she does not currently use drugs.  Family History:  Family History  Problem Relation Age of Onset   Heart attack Mother    Breast cancer Neg Hx      Prior to Admission medications   Medication Sig Start Date End Date Taking? Authorizing Provider  acetaminophen (TYLENOL) 325 MG tablet Take by mouth.    [provider]  aspirin 81 MG chewable tablet Chew 1 tablet by mouth daily.    [provider]  atorvastatin (LIPITOR) 80 MG tablet Take 1 tablet by mouth daily.    [provider]  buPROPion (WELLBUTRIN SR) 150 MG 12 hr tablet Take 1 tablet by mouth daily.    [provider]  DULoxetine (CYMBALTA) 60 MG capsule Take 60 mg by mouth daily.    [provider]  empagliflozin (JARDIANCE) 10 MG TABS tablet Take by mouth daily.    [provider]  gabapentin (NEURONTIN) 600 MG tablet Take 1 tablet by mouth 2 (two) times daily.    [provider]  Garlic 1000 MG CAPS Take 1 capsule by mouth daily.  [provider]  levothyroxine (SYNTHROID) 50 MCG tablet Take 1 tablet by mouth daily.    [provider]  loratadine (CLARITIN) 10 MG tablet Take 1 tablet by mouth daily.    [provider]  meloxicam (MOBIC) 15 MG tablet Take 15 mg by mouth daily. 12/02/20   [provider]  metFORMIN (GLUCOPHAGE-XR) 500 MG 24 hr tablet Take 500 mg by mouth daily with breakfast.    [provider]  Multiple Vitamins-Minerals  (MULTIVITAMIN WITH MINERALS) tablet Take 1 tablet by mouth daily.    [provider]  olopatadine (PATANOL) 0.1 % ophthalmic solution SMARTSIG:In Eye(s) 11/05/20   [provider]  triamterene-hydrochlorothiazide (MAXZIDE-25) 37.5-25 MG tablet Take 1 tablet by mouth daily.    [provider]    Physical Exam: Vitals:   03/13/23 2105 03/13/23 2130 03/13/23 2200 03/13/23 2300  BP:  108/67 119/67 113/60  Pulse: (!) 110 (!) 102 94 87  Resp:  (!) 21 20 (!) 22  Temp:      TempSrc:      SpO2: 96% 96% 100% 100%   General: Not in acute distress HEENT:       Eyes: PERRL, EOMI, no jaundice       ENT: No discharge from the ears and nose, no pharynx injection, no tonsillar enlargement.        Neck: No JVD, no bruit, no mass felt. Heme: No neck lymph node enlargement. Cardiac: S1/S2, RRR, No murmurs, No gallops or rubs. Respiratory: No rales, wheezing, rhonchi or rubs. GI: Soft, nondistended, has mild tenderness in lower abdomen,  no rebound pain, no organomegaly, BS present. GU: No hematuria Ext: No pitting leg edema bilaterally. 1+DP/PT pulse bilaterally. Musculoskeletal: No joint deformities, No joint redness or warmth, no limitation of ROM in spin. Skin: No rashes.  Neuro: Alert, oriented X3, cranial nerves II-XII grossly intact, moves all extremities normally.  Psych: Patient is not psychotic, no suicidal or hemocidal ideation.  Labs on Admission: I have personally reviewed following labs and imaging studies  CBC: Recent Labs  Lab 03/13/23 2100  WBC 19.9*  HGB 13.3  HCT 39.9  MCV 89.5  PLT 141*   Basic Metabolic Panel: Recent Labs  Lab 03/13/23 2100  NA 130*  K 3.3*  CL 92*  CO2 23  GLUCOSE 370*  BUN 65*  CREATININE 1.94*  CALCIUM 8.4*  MG 2.1   GFR: CrCl cannot be calculated (Unknown ideal weight.). Liver Function Tests: Recent Labs  Lab 03/13/23 2100  AST 49*  ALT 36  ALKPHOS 140*  BILITOT 1.9*  PROT 7.6  ALBUMIN 3.2*   No  results for input(s): "LIPASE", "AMYLASE" in the last 168 hours. No results for input(s): "AMMONIA" in the last 168 hours. Coagulation Profile: No results for input(s): "INR", "PROTIME" in the last 168 hours. Cardiac Enzymes: No results for input(s): "CKTOTAL", "CKMB", "CKMBINDEX", "TROPONINI" in the last 168 hours. BNP (last 3 results) No results for input(s): "PROBNP" in the last 8760 hours. HbA1C: No results for input(s): "HGBA1C" in the last 72 hours. CBG: Recent Labs  Lab 03/13/23 2010  GLUCAP 321*   Lipid Profile: No results for input(s): "CHOL", "HDL", "LDLCALC", "TRIG", "CHOLHDL", "LDLDIRECT" in the last 72 hours. Thyroid Function Tests: No results for input(s): "TSH", "T4TOTAL", "FREET4", "T3FREE", "THYROIDAB" in the last 72 hours. Anemia Panel: No results for input(s): "VITAMINB12", "FOLATE", "FERRITIN", "TIBC", "IRON", "RETICCTPCT" in the last 72 hours. Urine analysis:    Component Value Date/Time   COLORURINE YELLOW (A)  03/13/2023 2324   APPEARANCEUR HAZY (A) 03/13/2023 2324   LABSPEC 1.018 03/13/2023 2324   PHURINE 8.0 03/13/2023 2324   GLUCOSEU >=500 (A) 03/13/2023 2324   HGBUR SMALL (A) 03/13/2023 2324   BILIRUBINUR NEGATIVE 03/13/2023 2324   KETONESUR NEGATIVE 03/13/2023 2324   PROTEINUR 100 (A) 03/13/2023 2324   NITRITE NEGATIVE 03/13/2023 2324   LEUKOCYTESUR SMALL (A) 03/13/2023 2324   Sepsis Labs: @LABRCNTIP (procalcitonin:4,lacticidven:4) )No results found for this or any previous visit (from the past 240 hours).   Radiological Exams on Admission: DG Chest Portable 1 View Result Date: 03/13/2023 CLINICAL DATA:  Weakness and hyperglycemia. EXAM: PORTABLE CHEST 1 VIEW COMPARISON:  None Available. FINDINGS: The lungs are expiratory, limiting visualization of the lower lung fields. There are atelectatic bands in both bases. The visualized lungs are otherwise clear. There is mild cardiomegaly without evidence CHF. The aorta is tortuous and mildly ectatic with  scattered calcific plaques. The mediastinum otherwise unremarkable. There are osteophytes of the spine and both AC joints. No acute skeletal findings. IMPRESSION: 1. Expiratory chest with atelectatic bands in both bases. 2. No evidence of acute chest disease, but with limited view of the lower lung zones. 3. Mild cardiomegaly without evidence of CHF. 4. Aortic atherosclerosis. Electronically Signed   By: Almira Bar M.D.   On: 03/13/2023 21:28      Assessment/Plan Principal Problem:   Severe sepsis (HCC) Active Problems:   Myocardial injury   Abnormal LFTs   AKI (acute kidney injury) (HCC)   HLD (hyperlipidemia)   Hypothyroidism   Hypokalemia   Prediabetes   Assessment and Plan:   Principal Problem:   Severe sepsis (HCC) Active Problems:   Myocardial injury   Abnormal LFTs   AKI (acute kidney injury) (HCC)   HLD (hyperlipidemia)   Hypothyroidism   Hypokalemia   Prediabetes    DVT ppx: SQ Heparin       Code Status: Full code    Family Communication:    Yes, patient's  son  at bed side.       by phone  Disposition Plan:  Anticipate discharge back to previous environment  Consults called:  none  Admission status and Level of care: Telemetry Medical:   as inpt      Dispo: The patient is from: Home              Anticipated d/c is to: Home              Anticipated d/c date is: 2 days              Patient currently is not medically stable to d/c.    Severity of Illness:  The appropriate patient status for this patient is INPATIENT. Inpatient status is judged to be reasonable and necessary in order to provide the required intensity of service to ensure the patient's safety. The patient's presenting symptoms, physical exam findings, and initial radiographic and laboratory data in the context of their chronic comorbidities is felt to place them at high risk for further clinical deterioration. Furthermore, it is not anticipated that the patient will be medically stable  for discharge from the hospital within 2 midnights of admission.   * I certify that at the point of admission it is my clinical judgment that the patient will require inpatient hospital care spanning beyond 2 midnights from the point of admission due to high intensity of service, high risk for further deterioration and high frequency of surveillance required.*  Date of Service 03/13/2023    Lorretta Harp [facility]   If 7PM-7AM, please contact night-coverage [email or web address] 03/13/2023, 11:51 PM

## 2023-03-14 ENCOUNTER — Inpatient Hospital Stay: Payer: Medicare Other | Admitting: Certified Registered Nurse Anesthetist

## 2023-03-14 ENCOUNTER — Inpatient Hospital Stay: Payer: Medicare Other

## 2023-03-14 ENCOUNTER — Encounter
Admission: EM | Disposition: A | Discharge status: Home / Self Care | Payer: Self-pay | Source: Home / Self Care | Attending: Internal Medicine

## 2023-03-14 ENCOUNTER — Encounter: Payer: Self-pay | Admitting: Internal Medicine

## 2023-03-14 DIAGNOSIS — R7989 Other specified abnormal findings of blood chemistry: Secondary | ICD-10-CM | POA: Diagnosis not present

## 2023-03-14 DIAGNOSIS — R6520 Severe sepsis without septic shock: Secondary | ICD-10-CM

## 2023-03-14 DIAGNOSIS — I5A Non-ischemic myocardial injury (non-traumatic): Secondary | ICD-10-CM | POA: Diagnosis not present

## 2023-03-14 DIAGNOSIS — A419 Sepsis, unspecified organism: Secondary | ICD-10-CM | POA: Diagnosis not present

## 2023-03-14 DIAGNOSIS — N390 Urinary tract infection, site not specified: Secondary | ICD-10-CM | POA: Diagnosis present

## 2023-03-14 DIAGNOSIS — I10 Essential (primary) hypertension: Secondary | ICD-10-CM | POA: Diagnosis present

## 2023-03-14 DIAGNOSIS — N201 Calculus of ureter: Secondary | ICD-10-CM

## 2023-03-14 DIAGNOSIS — N3000 Acute cystitis without hematuria: Secondary | ICD-10-CM | POA: Diagnosis not present

## 2023-03-14 HISTORY — PX: CYSTOSCOPY W/ URETERAL STENT PLACEMENT: SHX1429

## 2023-03-14 LAB — BLOOD CULTURE ID PANEL (REFLEXED) - BCID2

## 2023-03-14 LAB — MAGNESIUM: Magnesium: 2.1 mg/dL (ref 1.7–2.4)

## 2023-03-14 LAB — TROPONIN I (HIGH SENSITIVITY)
Troponin I (High Sensitivity): 148 ng/L (ref <18)
Troponin I (High Sensitivity): 109 ng/L (ref <18)
Troponin I (High Sensitivity): 154 ng/L (ref <18)

## 2023-03-14 LAB — CBG MONITORING, ED
Glucose-Capillary: 178 mg/dL — ABNORMAL HIGH (ref 70–99)
Glucose-Capillary: 196 mg/dL — ABNORMAL HIGH (ref 70–99)
Glucose-Capillary: 260 mg/dL — ABNORMAL HIGH (ref 70–99)

## 2023-03-14 LAB — CBC
HCT: 31.3 % — ABNORMAL LOW (ref 36.0–46.0)
Hemoglobin: 10.5 g/dL — ABNORMAL LOW (ref 12.0–15.0)
MCH: 29.9 pg (ref 26.0–34.0)
MCHC: 33.5 g/dL (ref 30.0–36.0)
MCV: 89.2 fL (ref 80.0–100.0)
Platelets: 112 10*3/uL — ABNORMAL LOW (ref 150–400)
RBC: 3.51 MIL/uL — ABNORMAL LOW (ref 3.87–5.11)
RDW: 13.8 % (ref 11.5–15.5)
WBC: 16.3 10*3/uL — ABNORMAL HIGH (ref 4.0–10.5)
nRBC: 0 % (ref 0.0–0.2)

## 2023-03-14 LAB — COMPREHENSIVE METABOLIC PANEL
ALT: 27 U/L (ref 0–44)
AST: 32 U/L (ref 15–41)
Albumin: 2.4 g/dL — ABNORMAL LOW (ref 3.5–5.0)
Alkaline Phosphatase: 104 U/L (ref 38–126)
Anion gap: 10 (ref 5–15)
BUN: 54 mg/dL — ABNORMAL HIGH (ref 8–23)
CO2: 24 mmol/L (ref 22–32)
Calcium: 7.7 mg/dL — ABNORMAL LOW (ref 8.9–10.3)
Chloride: 102 mmol/L (ref 98–111)
Creatinine, Ser: 1.54 mg/dL — ABNORMAL HIGH (ref 0.44–1.00)
GFR, Estimated: 35 mL/min — ABNORMAL LOW (ref >60)
Glucose, Bld: 192 mg/dL — ABNORMAL HIGH (ref 70–99)
Potassium: 3.4 mmol/L — ABNORMAL LOW (ref 3.5–5.1)
Sodium: 136 mmol/L (ref 135–145)
Total Bilirubin: 1 mg/dL (ref <1.2)
Total Protein: 5.9 g/dL — ABNORMAL LOW (ref 6.5–8.1)

## 2023-03-14 LAB — LACTIC ACID, PLASMA
Lactic Acid, Venous: 2.4 mmol/L (ref 0.5–1.9)
Lactic Acid, Venous: 0.8 mmol/L (ref 0.5–1.9)

## 2023-03-14 LAB — LIPID PANEL
Cholesterol: 136 mg/dL (ref 0–200)
HDL: <10 mg/dL — ABNORMAL LOW (ref >40)
LDL Cholesterol: UNDETERMINED mg/dL (ref 0–99)
Triglycerides: 504 mg/dL — ABNORMAL HIGH (ref <150)
VLDL: UNDETERMINED mg/dL (ref 0–40)

## 2023-03-14 LAB — PHOSPHORUS: Phosphorus: 3.2 mg/dL (ref 2.5–4.6)

## 2023-03-14 LAB — PROCALCITONIN: Procalcitonin: 30.16 ng/mL

## 2023-03-14 LAB — LDL CHOLESTEROL, DIRECT: Direct LDL: 40 mg/dL (ref 0–99)

## 2023-03-14 LAB — HEMOGLOBIN A1C
Hgb A1c MFr Bld: 7.5 % — ABNORMAL HIGH (ref 4.8–5.6)
Mean Plasma Glucose: 168.55 mg/dL

## 2023-03-14 LAB — GLUCOSE, CAPILLARY: Glucose-Capillary: 237 mg/dL — ABNORMAL HIGH (ref 70–99)

## 2023-03-14 SURGERY — CYSTOSCOPY, WITH RETROGRADE PYELOGRAM AND URETERAL STENT INSERTION
Anesthesia: General

## 2023-03-14 MED ORDER — CEFTRIAXONE SODIUM 2 G IJ SOLR
2.0000 g | INTRAMUSCULAR | Status: DC
  Administered 2023-03-14 – 2023-03-16 (×3): 2 g via INTRAVENOUS
  Filled 2023-03-14 (×3): qty 20

## 2023-03-14 MED ORDER — ONDANSETRON HCL 4 MG/2ML IJ SOLN
INTRAMUSCULAR | Status: DC | PRN
  Administered 2023-03-14: 4 mg via INTRAVENOUS

## 2023-03-14 MED ORDER — OXYCODONE HCL 5 MG/5ML PO SOLN: 5.0000 mg | Freq: Once | ORAL | Status: DC | PRN

## 2023-03-14 MED ORDER — ACETAMINOPHEN 10 MG/ML IV SOLN: 1000.0000 mg | Freq: Once | INTRAVENOUS | Status: DC | PRN

## 2023-03-14 MED ORDER — GABAPENTIN 300 MG PO CAPS
600.0000 mg | ORAL_CAPSULE | Freq: Two times a day (BID) | ORAL | Status: DC
  Administered 2023-03-14 – 2023-03-17 (×7): 600 mg via ORAL
  Filled 2023-03-14 (×7): qty 2

## 2023-03-14 MED ORDER — ETOMIDATE 2 MG/ML IV SOLN
INTRAVENOUS | Status: AC
  Filled 2023-03-14: qty 10

## 2023-03-14 MED ORDER — SUCCINYLCHOLINE CHLORIDE 200 MG/10ML IV SOSY
PREFILLED_SYRINGE | INTRAVENOUS | Status: DC | PRN
  Administered 2023-03-14: 120 mg via INTRAVENOUS

## 2023-03-14 MED ORDER — BUPROPION HCL ER (SR) 150 MG PO TB12
150.0000 mg | ORAL_TABLET | Freq: Every day | ORAL | Status: DC
  Administered 2023-03-14 – 2023-03-17 (×4): 150 mg via ORAL
  Filled 2023-03-14 (×4): qty 1

## 2023-03-14 MED ORDER — IOHEXOL 180 MG/ML  SOLN
INTRAMUSCULAR | Status: DC | PRN
  Administered 2023-03-14: 10 mL

## 2023-03-14 MED ORDER — FENTANYL CITRATE (PF) 100 MCG/2ML IJ SOLN
INTRAMUSCULAR | Status: AC
  Filled 2023-03-14: qty 2

## 2023-03-14 MED ORDER — DEXAMETHASONE SODIUM PHOSPHATE 10 MG/ML IJ SOLN
INTRAMUSCULAR | Status: AC
  Filled 2023-03-14: qty 1

## 2023-03-14 MED ORDER — ETOMIDATE 2 MG/ML IV SOLN
INTRAVENOUS | Status: DC | PRN
  Administered 2023-03-14: 20 mg via INTRAVENOUS

## 2023-03-14 MED ORDER — SODIUM CHLORIDE 0.9 % IR SOLN
Status: DC | PRN
  Administered 2023-03-14: 3000 mL

## 2023-03-14 MED ORDER — POTASSIUM CHLORIDE CRYS ER 20 MEQ PO TBCR
40.0000 meq | EXTENDED_RELEASE_TABLET | Freq: Once | ORAL | Status: AC
  Administered 2023-03-14: 40 meq via ORAL
  Filled 2023-03-14: qty 2

## 2023-03-14 MED ORDER — SUCCINYLCHOLINE CHLORIDE 200 MG/10ML IV SOSY
PREFILLED_SYRINGE | INTRAVENOUS | Status: AC
  Filled 2023-03-14: qty 10

## 2023-03-14 MED ORDER — ONDANSETRON HCL 4 MG/2ML IJ SOLN
INTRAMUSCULAR | Status: AC
  Filled 2023-03-14: qty 2

## 2023-03-14 MED ORDER — DROPERIDOL 2.5 MG/ML IJ SOLN: 0.6250 mg | Freq: Once | INTRAMUSCULAR | Status: DC | PRN

## 2023-03-14 MED ORDER — OXYCODONE HCL 5 MG PO TABS: 5.0000 mg | ORAL_TABLET | Freq: Once | ORAL | Status: DC | PRN

## 2023-03-14 MED ORDER — LIDOCAINE HCL (CARDIAC) PF 100 MG/5ML IV SOSY
PREFILLED_SYRINGE | INTRAVENOUS | Status: DC | PRN
  Administered 2023-03-14: 100 mg via INTRAVENOUS

## 2023-03-14 MED ORDER — DEXAMETHASONE SODIUM PHOSPHATE 10 MG/ML IJ SOLN
INTRAMUSCULAR | Status: DC | PRN
  Administered 2023-03-14: 5 mg via INTRAVENOUS

## 2023-03-14 MED ORDER — LIDOCAINE HCL (PF) 2 % IJ SOLN
INTRAMUSCULAR | Status: AC
  Filled 2023-03-14: qty 5

## 2023-03-14 MED ORDER — ACETAMINOPHEN 10 MG/ML IV SOLN
INTRAVENOUS | Status: DC | PRN
  Administered 2023-03-14: 1000 mg via INTRAVENOUS

## 2023-03-14 MED ORDER — FENTANYL CITRATE (PF) 100 MCG/2ML IJ SOLN
INTRAMUSCULAR | Status: DC | PRN
  Administered 2023-03-14: 50 ug via INTRAVENOUS
  Administered 2023-03-14: 25 ug via INTRAVENOUS

## 2023-03-14 MED ORDER — OLOPATADINE HCL 0.1 % OP SOLN
1.0000 [drp] | Freq: Every day | OPHTHALMIC | Status: DC
Start: 2023-03-14 — End: 2023-03-17
  Administered 2023-03-14 – 2023-03-16 (×3): 1 [drp] via OPHTHALMIC
  Filled 2023-03-14: qty 5

## 2023-03-14 MED ORDER — FENTANYL CITRATE (PF) 100 MCG/2ML IJ SOLN: 25.0000 ug | INTRAMUSCULAR | Status: DC | PRN

## 2023-03-14 MED ORDER — ATORVASTATIN CALCIUM 20 MG PO TABS
80.0000 mg | ORAL_TABLET | Freq: Every day | ORAL | Status: DC
  Administered 2023-03-14 – 2023-03-17 (×4): 80 mg via ORAL
  Filled 2023-03-14 (×4): qty 4

## 2023-03-14 MED ORDER — LEVOTHYROXINE SODIUM 50 MCG PO TABS
50.0000 ug | ORAL_TABLET | Freq: Every day | ORAL | Status: DC
Start: 2023-03-14 — End: 2023-03-17
  Administered 2023-03-14 – 2023-03-17 (×4): 50 ug via ORAL
  Filled 2023-03-14 (×4): qty 1

## 2023-03-14 MED ORDER — PHENYLEPHRINE HCL-NACL 20-0.9 MG/250ML-% IV SOLN
INTRAVENOUS | Status: AC
  Filled 2023-03-14: qty 250

## 2023-03-14 SURGICAL SUPPLY — 17 items
BAG DRAIN SIEMENS DORNER NS (MISCELLANEOUS) ×2 IMPLANT
BRUSH SCRUB EZ 1% IODOPHOR (MISCELLANEOUS) ×2 IMPLANT
CATH URETL OPEN 5X70 (CATHETERS) ×2 IMPLANT
GLOVE BIO SURGEON STRL SZ 6.5 (GLOVE) ×2 IMPLANT
GOWN STRL REUS W/ TWL LRG LVL3 (GOWN DISPOSABLE) ×4 IMPLANT
GUIDEWIRE STR DUAL SENSOR (WIRE) ×2 IMPLANT
IV NS IRRIG 3000ML ARTHROMATIC (IV SOLUTION) ×2 IMPLANT
KIT TURNOVER CYSTO (KITS) ×2 IMPLANT
PACK CYSTO AR (MISCELLANEOUS) ×2 IMPLANT
SET CYSTO W/LG BORE CLAMP LF (SET/KITS/TRAYS/PACK) ×2 IMPLANT
STENT URET 6FRX22 CONTOUR (STENTS) IMPLANT
STENT URET 6FRX24 CONTOUR (STENTS) ×1 IMPLANT
STENT URET 6FRX26 CONTOUR (STENTS) IMPLANT
SURGILUBE 2OZ TUBE FLIPTOP (MISCELLANEOUS) ×2 IMPLANT
SYR TOOMEY IRRIG 70ML (MISCELLANEOUS) ×2
SYRINGE TOOMEY IRRIG 70ML (MISCELLANEOUS) ×2 IMPLANT
WATER STERILE IRR 500ML POUR (IV SOLUTION) ×2 IMPLANT

## 2023-03-14 NOTE — Assessment & Plan Note (Signed)
Improving.  Magnesium normal -Replace potassium and monitor

## 2023-03-14 NOTE — Progress Notes (Signed)
Progress Note   Patient: Kelsey Wise ZOX:[id number] DOB: 1945-10-15 DOA: 03/13/2023     1 DOS: the patient was seen and examined on 03/14/2023   Brief hospital course: Taken from H&P.   Kelsey Wise is a 77 y.o. female with medical history significant of HLD, pre-DM, hypothyroidism, carpal tunnel syndrome, back pain, arthritis, who presents with weakness, dysuria, lower abdominal pain for 4 days.  On presentation vitals stable, labs with  WBC 19.9, lactic acid 3.5, positive urinalysis (hazy appearance, small amount of leukocyte, negative bacteria, WBC> 50), troponin 127, potassium 3.3, magnesium 2.1, AKI with creatinine 1.94, BUN 65, GFR 26 (no baseline creatinine available), mild abnormal liver function (ALP 140, AST 49, ALT 36, total bilirubin 1.9).  Chest x-ray showed cardiomegaly and atelectasis.  EKG with sinus rhythm, QTc 432,bilateral atrial enlargement, LAD, poor R progression, low voltage.  Patient met severe sepsis criteria and received 1 dose of vancomycin, cefepime and Flagyl in ED and continued on Rocephin.  Cultures were drawn.  12/16: Vital stable.  Troponin peaked at 148, likely secondary to demand ischemia.  Leukocytosis started improving at 16.3 but all cell line decreased, likely some dilutional effect, CMP with potassium of 3.4, improving creatinine to 1.54, transaminitis resolved.  1 set of blood cultures with Proteus-pending identification.  Urine cultures pending.  Procalcitonin elevated at 30.16.  Ordered CT abdomen for significant lower abdominal pain, found to have an obstructing left ureteral calculus with associated hydroureteronephrosis. Urology was consulted and patient was made n.p.o.    Assessment and Plan: * Severe sepsis (HCC) Patient has severe sepsis with WBC 19.9, heart rate up to 130, RR 21, lactic acid 3.5 --> 2.4.  Significantly elevated procalcitonin Blood cultures with Proteus species.  Urine cultures pending. Also found to have obstructing left  ureteral calculus with associated hydroureteronephrosis. -Urology was consulted -Continue with Rocephin -Continue with supportive care  UTI (urinary tract infection) Left hydroureteronephrosis.  Due to obstructing renal stone. -Urology was consulted -Continue with Rocephin  Abnormal LFTs Likely secondary to severe sepsis, started improving. -Continue to monitor  Myocardial injury Troponin peaked at 148, now trending down.  No chest pain.  Likely demand ischemia with severe sepsis. -Continue aspirin, Lipitor  AKI (acute kidney injury) (HCC) Slowly improving, no baseline available. Also found to have left-sided hydroureteronephrosis. -Monitor renal function -Continue with IV fluid -Avoid nephrotoxin  Hypothyroidism -Continue Synthroid  HLD (hyperlipidemia) -Continue statin  Hypokalemia Improving.  Magnesium normal -Replace potassium and monitor  Prediabetes A1c of 7.5.  Patient was on Jardiance and metformin at home -Continue Semglee at 10 units daily -SSI  HTN (hypertension) -Hold Maxzide due to AKI -IV hydralazine as needed   Subjective: Patient was seen and examined today.  Having some lower and left-sided pain, a [place] interpreter was used to communicate.  She was also having lower back pain.  Physical Exam: Vitals:   03/14/23 0613 03/14/23 0945 03/14/23 1030 03/14/23 1230  BP:  (!) 118/51  (!) 102/59  Pulse:  86  83  Resp:  (!) 27  16  Temp: 97.9 F (36.6 C)  97.7 F (36.5 C)   TempSrc: Oral  Oral   SpO2:  97%  100%   General.  Overweight elderly lady, in no acute distress. Pulmonary.  Lungs clear bilaterally, normal respiratory effort. CV.  Regular rate and rhythm, no JVD, rub or murmur. Abdomen.  Soft, nontender, nondistended, BS positive. CNS.  Alert and oriented .  No focal neurologic deficit. Extremities.  No edema, no cyanosis, pulses  intact and symmetrical. Psychiatry.  Judgment and insight appears normal.   Data Reviewed: Prior data  reviewed  Family Communication: Discussed with son on phone  Disposition: Status is: Inpatient Remains inpatient appropriate because: Severity of illness  Planned Discharge Destination: Home  DVT prophylaxis.  Subcu heparin Time spent: 50 minutes  This record has been created using Conservation officer, historic buildings. Errors have been sought and corrected,but may not always be located. Such creation errors do not reflect on the standard of care.   Author: Arnetha Courser, MD 03/14/2023 2:17 PM  For on call review [email or web address].

## 2023-03-14 NOTE — Assessment & Plan Note (Signed)
Likely secondary to severe sepsis, started improving. -Continue to monitor

## 2023-03-14 NOTE — Anesthesia Procedure Notes (Addendum)
Procedure Name: Intubation Date/Time: 03/14/2023 5:13 PM  Performed by: Elisabeth Pigeon, CRNAPre-anesthesia Checklist: Patient identified, Patient being monitored, Timeout performed, Emergency Drugs available and Suction available Patient Re-evaluated:Patient Re-evaluated prior to induction Oxygen Delivery Method: Circle system utilized Preoxygenation: Pre-oxygenation with 100% oxygen Induction Type: IV induction, Rapid sequence and Cricoid Pressure applied Ventilation: Mask ventilation without difficulty Laryngoscope Size: Mac and 3 Grade View: Grade I Tube type: Oral Tube size: 7.0 mm Number of attempts: 1 Airway Equipment and Method: Stylet Placement Confirmation: ETT inserted through vocal cords under direct vision, positive ETCO2 and breath sounds checked- equal and bilateral Secured at: 22 cm Tube secured with: Tape Dental Injury: Teeth and Oropharynx as per pre-operative assessment

## 2023-03-14 NOTE — ED Notes (Signed)
Patient given a breakfast tray.

## 2023-03-14 NOTE — Assessment & Plan Note (Signed)
A1c of 7.5.  Patient was on Jardiance and metformin at home -Continue Semglee at 10 units daily -SSI

## 2023-03-14 NOTE — Progress Notes (Signed)
Patient awake, unable to obtain visual interpreter for greek. Only available audio. Son at bedside. Patient without event , denies pain at this time.

## 2023-03-14 NOTE — Assessment & Plan Note (Signed)
Continue Synthroid °

## 2023-03-14 NOTE — ED Notes (Signed)
Pt assisted to toilet in rm. Hospital gown placed on pt at this time. Bed linen changed. Call light within reach. Pt has no further needs at this time.

## 2023-03-14 NOTE — ED Notes (Signed)
Patient given a lunch tray.  

## 2023-03-14 NOTE — Assessment & Plan Note (Signed)
Troponin peaked at 148, now trending down.  No chest pain.  Likely demand ischemia with severe sepsis. -Continue aspirin, Lipitor

## 2023-03-14 NOTE — ED Notes (Signed)
Patient complains of pain in her hips on movement.

## 2023-03-14 NOTE — ED Notes (Signed)
 Patient assisted to use bedpan.  No complaints voiced at this time

## 2023-03-14 NOTE — Consult Note (Signed)
Urology Consult  I have been asked to see the patient by Dr. Nelson Chimes, for evaluation and management of sepsis/left ureteral stone.  Chief Complaint: Back pain  History of Present Illness: Kelsey Wise is a 77 y.o. year old [place] speaking female who presented to the emergency room yesterday with several day illness, weakness and back pain.  She also had dysuria urgency frequency lower abdominal pain and chills.  She was found to have a significant leukocytosis to 19.9, lactate of 3.5, frankly positive urinalysis, AKI with creatinine of 1.9.  Unfortunately though no other imaging was performed yesterday.  She was started on Rocephin but did also receive a dose of Vanco cefepime and Flagyl in the emergency room.  Working diagnosis was UTI and blood cultures have since returned positive as well.  Today on rounds, she was complaining of significant pain and was uncomfortable.  She had a CT abdomen pelvis without contrast that shows a 6 mm left proximal ureteral calculus with significant hydronephrosis.  She is accompanied today by her son.  She has no history of kidney stones.  Past Medical History:  Diagnosis Date   Carpal tunnel syndrome    HLD (hyperlipidemia)    Hypothyroidism    Venous insufficiency     Past Surgical History:  Procedure Laterality Date   uterian tumor removal per patient      Home Medications:  Current Meds  Medication Sig   acetaminophen (TYLENOL) 325 MG tablet Take by mouth.   aspirin 81 MG chewable tablet Chew 1 tablet by mouth daily.   atorvastatin (LIPITOR) 80 MG tablet Take 1 tablet by mouth daily.   buPROPion (WELLBUTRIN SR) 150 MG 12 hr tablet Take 1 tablet by mouth daily.   DULoxetine (CYMBALTA) 60 MG capsule Take 60 mg by mouth daily.   empagliflozin (JARDIANCE) 10 MG TABS tablet Take by mouth daily.   gabapentin (NEURONTIN) 600 MG tablet Take 1 tablet by mouth 2 (two) times daily.   Garlic 1000 MG CAPS Take 1 capsule by mouth daily.    levothyroxine (SYNTHROID) 50 MCG tablet Take 1 tablet by mouth daily.   loratadine (CLARITIN) 10 MG tablet Take 1 tablet by mouth daily.   meloxicam (MOBIC) 15 MG tablet Take 15 mg by mouth daily.   metFORMIN (GLUCOPHAGE-XR) 500 MG 24 hr tablet Take 500 mg by mouth daily with breakfast.   Multiple Vitamins-Minerals (MULTIVITAMIN WITH MINERALS) tablet Take 1 tablet by mouth daily.   olopatadine (PATANOL) 0.1 % ophthalmic solution SMARTSIG:In Eye(s)   triamterene-hydrochlorothiazide (MAXZIDE-25) 37.5-25 MG tablet Take 1 tablet by mouth daily.    Allergies: No Known Allergies  Family History  Problem Relation Age of Onset   Heart attack Mother    Breast cancer Neg Hx     Social History:  reports that she has never smoked. She has never used smokeless tobacco. She reports that she does not currently use alcohol. She reports that she does not currently use drugs.  ROS: A complete review of systems was performed.  All systems are negative except for pertinent findings as noted.  Physical Exam:  Vital signs in last 24 hours: Temp:  [97.5 F (36.4 C)-97.9 F (36.6 C)] 97.9 F (36.6 C) (12/16 1618) Pulse Rate:  [80-130] 104 (12/16 1618) Resp:  [16-31] 20 (12/16 1618) BP: (94-124)/(50-76) 104/54 (12/16 1618) SpO2:  [54 %-100 %] 92 % (12/16 1618) Weight:  [92 kg] 92 kg (12/16 1618) Constitutional:  Alert and oriented, No acute distress  HEENT: [place] AT, moist mucus membranes.  Trachea midline, no masses Cardiovascular: Regular rate and rhythm, no clubbing, cyanosis, or edema. Respiratory: Normal respiratory effort, lungs clear bilaterally GI: Abdomen is soft, nontender, nondistended, no abdominal masses GU: No CVA tenderness Skin: No rashes, bruises or suspicious lesions Neurologic: Grossly intact, no focal deficits, moving all 4 extremities Psychiatric: Normal mood and affect   Laboratory Data:  Recent Labs    03/13/23 2100 03/14/23 0616  WBC 19.9* 16.3*  HGB 13.3 10.5*  HCT 39.9  31.3*   Recent Labs    03/13/23 2100 03/14/23 0616  NA 130* 136  K 3.3* 3.4*  CL 92* 102  CO2 23 24  GLUCOSE 370* 192*  BUN 65* 54*  CREATININE 1.94* 1.54*  CALCIUM 8.4* 7.7*   Recent Labs    03/13/23 2324  INR 1.2   No results for input(s): "LABURIN" in the last 72 hours. Results for orders placed or performed during the hospital encounter of 03/13/23  Culture, blood (routine x 2)     Status: None (Preliminary result)   Collection Time: 03/13/23  9:00 PM   Specimen: BLOOD RIGHT ARM  Result Value Ref Range Status   Specimen Description BLOOD RIGHT ARM  Final   Special Requests   Final    BOTTLES DRAWN AEROBIC AND ANAEROBIC Blood Culture results may not be optimal due to an inadequate volume of blood received in culture bottles   Culture  Setup Time   Final    GRAM NEGATIVE RODS IN BOTH AEROBIC AND ANAEROBIC BOTTLES Organism ID to follow CRITICAL RESULT CALLED TO, READ BACK BY AND VERIFIED WITHMila Merry PHARMD [id number] 03/14/23 HNM Performed at [facility], [address] [place], [place] [address]    Culture GRAM NEGATIVE RODS  Final   Report Status PENDING  Incomplete  Blood Culture ID Panel (Reflexed)     Status: Abnormal   Collection Time: 03/13/23  9:00 PM  Result Value Ref Range Status   Enterococcus faecalis NOT DETECTED NOT DETECTED Final   Enterococcus Faecium NOT DETECTED NOT DETECTED Final   Listeria monocytogenes NOT DETECTED NOT DETECTED Final   Staphylococcus species NOT DETECTED NOT DETECTED Final   Staphylococcus aureus (BCID) NOT DETECTED NOT DETECTED Final   Staphylococcus epidermidis NOT DETECTED NOT DETECTED Final   Staphylococcus lugdunensis NOT DETECTED NOT DETECTED Final   Streptococcus species NOT DETECTED NOT DETECTED Final   Streptococcus agalactiae NOT DETECTED NOT DETECTED Final   Streptococcus pneumoniae NOT DETECTED NOT DETECTED Final   Streptococcus pyogenes NOT DETECTED NOT DETECTED Final   A.calcoaceticus-baumannii NOT  DETECTED NOT DETECTED Final   Bacteroides fragilis NOT DETECTED NOT DETECTED Final   Enterobacterales DETECTED (A) NOT DETECTED Final    Comment: Enterobacterales represent a large order of gram negative bacteria, not a single organism. CRITICAL RESULT CALLED TO, READ BACK BY AND VERIFIED WITH: Mila Merry PHARMD [id number] 03/14/23 HNM    Enterobacter cloacae complex NOT DETECTED NOT DETECTED Final   Escherichia coli NOT DETECTED NOT DETECTED Final   Klebsiella aerogenes NOT DETECTED NOT DETECTED Final   Klebsiella oxytoca NOT DETECTED NOT DETECTED Final   Klebsiella pneumoniae NOT DETECTED NOT DETECTED Final   Proteus species DETECTED (A) NOT DETECTED Final    Comment: CRITICAL RESULT CALLED TO, READ BACK BY AND VERIFIED WITH: WALID NAZARI PHARMD [id number] 03/14/23 HNM    Salmonella species NOT DETECTED NOT DETECTED Final   Serratia marcescens NOT DETECTED NOT DETECTED Final   Haemophilus influenzae NOT DETECTED  NOT DETECTED Final   Neisseria meningitidis NOT DETECTED NOT DETECTED Final   Pseudomonas aeruginosa NOT DETECTED NOT DETECTED Final   Stenotrophomonas maltophilia NOT DETECTED NOT DETECTED Final   Candida albicans NOT DETECTED NOT DETECTED Final   Candida auris NOT DETECTED NOT DETECTED Final   Candida glabrata NOT DETECTED NOT DETECTED Final   Candida krusei NOT DETECTED NOT DETECTED Final   Candida parapsilosis NOT DETECTED NOT DETECTED Final   Candida tropicalis NOT DETECTED NOT DETECTED Final   Cryptococcus neoformans/gattii NOT DETECTED NOT DETECTED Final   CTX-M ESBL NOT DETECTED NOT DETECTED Final   Carbapenem resistance IMP NOT DETECTED NOT DETECTED Final   Carbapenem resistance KPC NOT DETECTED NOT DETECTED Final   Carbapenem resistance NDM NOT DETECTED NOT DETECTED Final   Carbapenem resist OXA 48 LIKE NOT DETECTED NOT DETECTED Final   Carbapenem resistance VIM NOT DETECTED NOT DETECTED Final    Comment: Performed at [facility], [address]  [place], [place] [address]  Culture, blood (routine x 2)     Status: None (Preliminary result)   Collection Time: 03/13/23  9:02 PM   Specimen: BLOOD RIGHT ARM  Result Value Ref Range Status   Specimen Description BLOOD RIGHT ARM  Final   Special Requests   Final    BOTTLES DRAWN AEROBIC AND ANAEROBIC Blood Culture results may not be optimal due to an inadequate volume of blood received in culture bottles   Culture  Setup Time   Final    GRAM NEGATIVE RODS IN BOTH AEROBIC AND ANAEROBIC BOTTLES CRITICAL VALUE NOTED.  VALUE IS CONSISTENT WITH PREVIOUSLY REPORTED AND CALLED VALUE. Performed at [facility], [address], [place], [place] [address]    Culture GRAM NEGATIVE RODS  Final   Report Status PENDING  Incomplete     Radiologic Imaging: DG OR UROLOGY CYSTO IMAGE [facility] ONLY) Result Date: 03/14/2023 There is no interpretation for this exam.  This order is for images obtained during a surgical procedure.  Please See "Surgeries" Tab for more information regarding the procedure.   CT ABDOMEN PELVIS WO CONTRAST Result Date: 03/14/2023 CLINICAL DATA:  Sepsis EXAM: CT ABDOMEN AND PELVIS WITHOUT CONTRAST TECHNIQUE: Multidetector CT imaging of the abdomen and pelvis was performed following the standard protocol without IV contrast. RADIATION DOSE REDUCTION: This exam was performed according to the departmental dose-optimization program which includes automated exposure control, adjustment of the mA and/or kV according to patient size and/or use of iterative reconstruction technique. COMPARISON:  None Available. FINDINGS: Lower chest: Streaky bibasilar atelectasis and probable scarring changes. Very small bilateral pleural effusions. No pericardial effusion. Hepatobiliary: No worrisome hepatic lesions are identified without contrast. No intrahepatic biliary dilatation. Small calcified granuloma noted in the right hepatic. Gallbladder is mildly distended with maximum transverse diameter 4.8  cm. No calcified gallstones or pericholecystic inflammatory changes or fluid. Normal caliber common bile duct. Pancreas: Unremarkable. No pancreatic ductal dilatation or surrounding inflammatory changes. Spleen: Normal in size without focal abnormality. Adrenals/Urinary Tract: Adrenal glands are normal. No renal calculi or renal mass. There is significant left-sided hydronephrosis and proximal left hydroureter down to an obstructing 6 x 5 mm calculus in the upper left ureter noted at the L3-4 disc space level. No right-sided ureteral calculi. No bladder calculi. No obvious bladder mass. Stomach/Bowel: The stomach, duodenum, small bowel and colon are grossly normal without oral contrast. No inflammatory changes, mass lesions or obstructive findings. Sigmoid colon diverticulosis. Vascular/Lymphatic: The aorta is normal in caliber. Scattered atheroscerlotic calcifications.  No mesenteric of retroperitoneal mass or adenopathy. Small scattered lymph nodes are noted. Reproductive: The uterus and ovaries are unremarkable. Other: No pelvic mass or adenopathy. No free pelvic fluid collections. No inguinal mass or adenopathy. No abdominal wall hernia or subcutaneous lesions. Musculoskeletal: No significant bony findings. IMPRESSION: 1. 6 x 5 mm obstructing upper left ureteral calculus with associated hydroureteronephrosis. 2. No other urinary tract calculi. 3. Sigmoid colon diverticulosis. 4. Mildly distended gallbladder but no calcified gallstones or pericholecystic inflammatory changes or fluid. 5. Very small bilateral pleural effusions and bibasilar atelectasis. Electronically Signed   By: Rudie Meyer M.D.   On: 03/14/2023 11:41   DG Chest Portable 1 View Result Date: 03/13/2023 CLINICAL DATA:  Weakness and hyperglycemia. EXAM: PORTABLE CHEST 1 VIEW COMPARISON:  None Available. FINDINGS: The lungs are expiratory, limiting visualization of the lower lung fields. There are atelectatic bands in both bases. The visualized  lungs are otherwise clear. There is mild cardiomegaly without evidence CHF. The aorta is tortuous and mildly ectatic with scattered calcific plaques. The mediastinum otherwise unremarkable. There are osteophytes of the spine and both AC joints. No acute skeletal findings. IMPRESSION: 1. Expiratory chest with atelectatic bands in both bases. 2. No evidence of acute chest disease, but with limited view of the lower lung zones. 3. Mild cardiomegaly without evidence of CHF. 4. Aortic atherosclerosis. Electronically Signed   By: Almira Bar M.D.   On: 03/13/2023 21:28   CT scan was personally reviewed, agree with radiologic interpretation.  Impression/ Plan:  77 year old female with obstructing left ureteral stone meeting sepsis criteria.  I have recommended that we proceed to the operating room for urgent left ureteral stent placement for the purpose of source control especially in the setting of bacteremia.  1.  Septic obstructing ureteral calculus-risk and benefits of ureteral stent were discussed in detail including risk of bleeding, damage to the ureter tube, stent discomfort and need for further procedures amongst others.  All questions were answered.  [place] translator was used for this when I also discussed it with her son as well.  She will need delayed staged procedure down the road once her infection is cleared.  Continue IV antibiotics and supportive care per the primary team.  2.  AKI-secondary to obstruction as well as prerenal.  Avoid nephrotoxic agents.  Should improve with hydration and ureteral stenting.  03/14/2023, 4:47 PM  Vanna Scotland,  MD

## 2023-03-14 NOTE — ED Notes (Addendum)
Patient's CMP blood work drawn this morning. Pt's blood sugar 192.

## 2023-03-14 NOTE — Assessment & Plan Note (Signed)
-  Hold Maxzide due to AKI -IV hydralazine as needed

## 2023-03-14 NOTE — Assessment & Plan Note (Signed)
Left hydroureteronephrosis.  Due to obstructing renal stone. -Urology was consulted -Continue with Rocephin

## 2023-03-14 NOTE — Anesthesia Preprocedure Evaluation (Addendum)
Anesthesia Evaluation  Patient identified by MRN, date of birth, ID band Patient awake    Reviewed: Allergy & Precautions, H&P , NPO status , Patient's Chart, lab work & pertinent test results  Airway Mallampati: IV  TM Distance: >3 FB Neck ROM: full    Dental  (+) Edentulous Upper, Edentulous Lower   Pulmonary neg pulmonary ROS  tachypnea         Cardiovascular hypertension, Normal cardiovascular exam  EKG : Sinus tachycardia Right atrial enlargement Minimal voltage criteria for LVH, may be normal variant ( R in aVL ) Anterolateral infarct , age undetermined Abnormal ECG No previous ECGs available   Neuro/Psych  Neuromuscular disease (Numbness and tingling of right upper extremity, RUE weakness)  negative psych ROS   GI/Hepatic negative GI ROS, Neg liver ROS,,,  Endo/Other  diabetes, Type 2Hypothyroidism    Renal/GU Renal InsufficiencyRenal diseaseSEPTIC STONE     Musculoskeletal   Abdominal  (+) + obese  Peds  Hematology  (+) Blood dyscrasia, anemia thrombocytopenia   Anesthesia Other Findings Septic Stone  Per IM: * Severe sepsis (HCC) Patient has severe sepsis with WBC 19.9, heart rate up to 130, RR 21, lactic acid 3.5 --> 2.4.  Significantly elevated procalcitonin Blood cultures with Proteus species.  Urine cultures pending. Also found to have obstructing left ureteral calculus with associated hydroureteronephrosis. -Urology was consulted -Continue with Rocephin -Continue with supportive care   UTI (urinary tract infection) Left hydroureteronephrosis.  Due to obstructing renal stone. -Urology was consulted -Continue with Rocephin   Abnormal LFTs Likely secondary to severe sepsis, started improving. -Continue to monitor   Myocardial injury Troponin peaked at 148, now trending down.  No chest pain.  Likely demand ischemia with severe sepsis. -Continue aspirin, Lipitor   AKI (acute kidney injury)  (HCC) Slowly improving, no baseline available. Also found to have left-sided hydroureteronephrosis. -Monitor renal function -Continue with IV fluid -Avoid nephrotoxin   Hypothyroidism -Continue Synthroid   HLD (hyperlipidemia) -Continue statin   Hypokalemia Improving.  Magnesium normal -Replace potassium and monitor   Prediabetes A1c of 7.5.  Patient was on Jardiance and metformin at home -Continue Semglee at 10 units daily -SSI   HTN (hypertension) -Hold Maxzide due to AKI -IV hydralazine as needed        Past Medical History: No date: Carpal tunnel syndrome No date: HLD (hyperlipidemia) No date: Hypothyroidism No date: Venous insufficiency  Past Surgical History: No date: uterian tumor removal per patient     Reproductive/Obstetrics negative OB ROS                             Anesthesia Physical Anesthesia Plan  ASA: 4 and emergent  Anesthesia Plan: General ETT   Post-op Pain Management: Ofirmev IV (intra-op)*   Induction: Intravenous, Rapid sequence and Cricoid pressure planned  PONV Risk Score and Plan: 2 and Ondansetron and Dexamethasone  Airway Management Planned: Oral ETT  Additional Equipment:   Intra-op Plan:   Post-operative Plan: Extubation in OR  Informed Consent: I have reviewed the patients History and Physical, chart, labs and discussed the procedure including the risks, benefits and alternatives for the proposed anesthesia with the patient or authorized representative who has indicated his/her understanding and acceptance.     Dental Advisory Given  Plan Discussed with: CRNA and Surgeon  Anesthesia Plan Comments:         Anesthesia Quick Evaluation

## 2023-03-14 NOTE — Hospital Course (Addendum)
Taken from H&P.   Kelsey Wise is a 77 y.o. female with medical history significant of HLD, pre-DM, hypothyroidism, carpal tunnel syndrome, back pain, arthritis, who presents with weakness, dysuria, lower abdominal pain for 4 days.  On presentation vitals stable, labs with  WBC 19.9, lactic acid 3.5, positive urinalysis (hazy appearance, small amount of leukocyte, negative bacteria, WBC> 50), troponin 127, potassium 3.3, magnesium 2.1, AKI with creatinine 1.94, BUN 65, GFR 26 (no baseline creatinine available), mild abnormal liver function (ALP 140, AST 49, ALT 36, total bilirubin 1.9).  Chest x-ray showed cardiomegaly and atelectasis.  EKG with sinus rhythm, QTc 432,bilateral atrial enlargement, LAD, poor R progression, low voltage.  Patient met severe sepsis criteria and received 1 dose of vancomycin, cefepime and Flagyl in ED and continued on Rocephin.  Cultures were drawn.  12/16: Vital stable.  Troponin peaked at 148, likely secondary to demand ischemia.  Leukocytosis started improving at 16.3 but all cell line decreased, likely some dilutional effect, CMP with potassium of 3.4, improving creatinine to 1.54, transaminitis resolved.  1 set of blood cultures with Proteus-pending identification.  Urine cultures pending.  Procalcitonin elevated at 30.16.  Ordered CT abdomen for significant lower abdominal pain, found to have an obstructing left ureteral calculus with associated hydroureteronephrosis. Urology was consulted and patient was made n.p.o.

## 2023-03-14 NOTE — Assessment & Plan Note (Signed)
Patient has severe sepsis with WBC 19.9, heart rate up to 130, RR 21, lactic acid 3.5 --> 2.4.  Significantly elevated procalcitonin Blood cultures with Proteus species.  Urine cultures pending. Also found to have obstructing left ureteral calculus with associated hydroureteronephrosis. -Urology was consulted -Continue with Rocephin -Continue with supportive care

## 2023-03-14 NOTE — Assessment & Plan Note (Signed)
Slowly improving, no baseline available. Also found to have left-sided hydroureteronephrosis. -Monitor renal function -Continue with IV fluid -Avoid nephrotoxin

## 2023-03-14 NOTE — Anesthesia Postprocedure Evaluation (Signed)
Anesthesia Post Note  Patient: Kelsey Wise  Procedure(s) Performed: CYSTOSCOPY WITH RETROGRADE PYELOGRAM/URETERAL STENT PLACEMENT  Patient location during evaluation: PACU Anesthesia Type: General Level of consciousness: awake and alert Pain management: pain level controlled Vital Signs Assessment: post-procedure vital signs reviewed and stable Respiratory status: spontaneous breathing, nonlabored ventilation, respiratory function stable and patient connected to nasal cannula oxygen Cardiovascular status: blood pressure returned to baseline and stable Postop Assessment: no apparent nausea or vomiting Anesthetic complications: no   No notable events documented.   Last Vitals:  Vitals:   03/14/23 1900 03/14/23 1916  BP: 98/73 (!) 118/55  Pulse: 88 87  Resp: (!) 22 19  Temp: 37.2 C   SpO2: 93% 93%    Last Pain:  Vitals:   03/14/23 1900  TempSrc:   PainSc: 0-No pain                 Foye Deer

## 2023-03-14 NOTE — Transfer of Care (Signed)
Immediate Anesthesia Transfer of Care Note  Patient: Kelsey Wise  Procedure(s) Performed: CYSTOSCOPY WITH RETROGRADE PYELOGRAM/URETERAL STENT PLACEMENT  Patient Location: PACU  Anesthesia Type:General  Level of Consciousness: drowsy  Airway & Oxygen Therapy: Patient Spontanous Breathing and Patient connected to face mask oxygen  Post-op Assessment: Report given to RN and Post -op Vital signs reviewed and stable  Post vital signs: Reviewed and stable  Last Vitals:  Vitals Value Taken Time  BP 129/60 03/14/23 1754  Temp    Pulse 103 03/14/23 1756  Resp 29 03/14/23 1756  SpO2 94 % 03/14/23 1756  Vitals shown include unfiled device data.  Last Pain:  Vitals:   03/14/23 1618  TempSrc: Temporal  PainSc: 5       Patients Stated Pain Goal: 0 (03/14/23 1618)  Complications: No notable events documented.

## 2023-03-14 NOTE — Progress Notes (Signed)
PHARMACY - PHYSICIAN COMMUNICATION CRITICAL VALUE ALERT - BLOOD CULTURE IDENTIFICATION (BCID)  Kelsey Wise is an 77 y.o. female who presented to [facility] on 03/13/2023 with a chief complaint of weakness, dysuria and abdominal pain  Assessment:  12/15 blood culture with GNR, BCID detects Proteus species.  Source appears to be UTI  Name of physician (or Provider) Contacted: Dr Nelson Chimes  Current antibiotics: Ceftriaxone  Changes to prescribed antibiotics recommended:  Patient is on recommended antibiotics - No changes needed  Results for orders placed or performed during the hospital encounter of 03/13/23  Blood Culture ID Panel (Reflexed) (Collected: 03/13/2023  9:00 PM)  Result Value Ref Range   Enterococcus faecalis NOT DETECTED NOT DETECTED   Enterococcus Faecium NOT DETECTED NOT DETECTED   Listeria monocytogenes NOT DETECTED NOT DETECTED   Staphylococcus species NOT DETECTED NOT DETECTED   Staphylococcus aureus (BCID) NOT DETECTED NOT DETECTED   Staphylococcus epidermidis NOT DETECTED NOT DETECTED   Staphylococcus lugdunensis NOT DETECTED NOT DETECTED   Streptococcus species NOT DETECTED NOT DETECTED   Streptococcus agalactiae NOT DETECTED NOT DETECTED   Streptococcus pneumoniae NOT DETECTED NOT DETECTED   Streptococcus pyogenes NOT DETECTED NOT DETECTED   A.calcoaceticus-baumannii NOT DETECTED NOT DETECTED   Bacteroides fragilis NOT DETECTED NOT DETECTED   Enterobacterales DETECTED (A) NOT DETECTED   Enterobacter cloacae complex NOT DETECTED NOT DETECTED   Escherichia coli NOT DETECTED NOT DETECTED   Klebsiella aerogenes NOT DETECTED NOT DETECTED   Klebsiella oxytoca NOT DETECTED NOT DETECTED   Klebsiella pneumoniae NOT DETECTED NOT DETECTED   Proteus species DETECTED (A) NOT DETECTED   Salmonella species NOT DETECTED NOT DETECTED   Serratia marcescens NOT DETECTED NOT DETECTED   Haemophilus influenzae NOT DETECTED NOT DETECTED   Neisseria meningitidis NOT DETECTED NOT  DETECTED   Pseudomonas aeruginosa NOT DETECTED NOT DETECTED   Stenotrophomonas maltophilia NOT DETECTED NOT DETECTED   Candida albicans NOT DETECTED NOT DETECTED   Candida auris NOT DETECTED NOT DETECTED   Candida glabrata NOT DETECTED NOT DETECTED   Candida krusei NOT DETECTED NOT DETECTED   Candida parapsilosis NOT DETECTED NOT DETECTED   Candida tropicalis NOT DETECTED NOT DETECTED   Cryptococcus neoformans/gattii NOT DETECTED NOT DETECTED   CTX-M ESBL NOT DETECTED NOT DETECTED   Carbapenem resistance IMP NOT DETECTED NOT DETECTED   Carbapenem resistance KPC NOT DETECTED NOT DETECTED   Carbapenem resistance NDM NOT DETECTED NOT DETECTED   Carbapenem resist OXA 48 LIKE NOT DETECTED NOT DETECTED   Carbapenem resistance VIM NOT DETECTED NOT DETECTED   Juliette Alcide, PharmD, BCPS, BCIDP Work Cell: [phone] 03/14/2023 11:13 AM

## 2023-03-14 NOTE — Op Note (Signed)
Date of procedure: 03/14/23  Preoperative diagnosis:  Left obstructing ureteral stone Sepsis of urinary source  Postoperative diagnosis:  Same as above  Procedure: Cystoscopy Left retrograde pyelogram Left ureteral stent placement  Surgeon: Vanna Scotland, MD  Anesthesia: General  Complications: None  Intraoperative findings: Significant cystocele noted.  Bladder diffusely inflamed with debris.  Tortuosity at the level of the stone, initial difficulty getting the wire by the stent and stent in appropriate position but ultimately successful.  Drains: 6 x 24 French double-J ureteral stent  Specimens: None  Indication: Kelsey Wise is a 77 y.o. patient with obstructing ureteral calculus, 6 mm and sepsis of urinary source.  After reviewing the management options for treatment, he elected to proceed with the above surgical procedure(s). We have discussed the potential benefits and risks of the procedure, side effects of the proposed treatment, the likelihood of the patient achieving the goals of the procedure, and any potential problems that might occur during the procedure or recuperation. Informed consent has been obtained.  Description of procedure:  The patient was taken to the operating room and general anesthesia was induced.  The patient was placed in the dorsal lithotomy position, prepped and draped in the usual sterile fashion, and preoperative antibiotics were administered. A preoperative time-out was performed.   21 French cystoscope was advanced per urethra into the bladder.  Notably, she had significant vaginal vault prolapse including a cystocele which was noted with descent of the bladder neck.  The bladder was diffusely erythematous with cloudy urine consistent with known cystitis.  Attention was turned to the left ureteral orifice.  Gentle retrograde pyelogram was performed which showed caliber change at the level of the proximal stone as well as some tortuosity at this  level as well.  There was some hydronephrosis, mild to moderate in the kidney.  I then made several attempts to getting a sensor wire up to the level of the stone.  This required advancing the open-ended ureteral catheter and then maneuvering the wire in multiple ways until this was accomplished.  I then placed a 6 x 22 French stent given her relatively short stature but due to the dissent in her bladder, this was not long enough and the curl was in the proximal ureter.  I use graspers to grasp and bring this to level the urethral meatus and the stent was recannulated up to the level of the kidney.  It was then exchanged for a 6 x 24 [place] and I ended up having to try to deploy this twice until I was satisfied with the ultimate position with a curl in the renal pelvis as well as within the bladder.  The bladder was then drained.  The patient was then cleaned and dried, repositioned the supine position, reversed of anesthesia, and taken to the PACU in stable condition.  Plan: She will be seen on postop day 1 and outpatient ureteroscopy will be arranged.  Continue supportive care and IV antibiotics, adjust based on culture and sensitivity data.  Vanna Scotland, M.D.

## 2023-03-14 NOTE — Assessment & Plan Note (Signed)
Continue statin. 

## 2023-03-15 ENCOUNTER — Other Ambulatory Visit: Payer: Self-pay | Admitting: Physician Assistant

## 2023-03-15 ENCOUNTER — Encounter: Payer: Self-pay | Admitting: Urology

## 2023-03-15 DIAGNOSIS — Z87442 Personal history of urinary calculi: Secondary | ICD-10-CM

## 2023-03-15 DIAGNOSIS — A419 Sepsis, unspecified organism: Secondary | ICD-10-CM | POA: Diagnosis not present

## 2023-03-15 DIAGNOSIS — N201 Calculus of ureter: Secondary | ICD-10-CM

## 2023-03-15 DIAGNOSIS — R6520 Severe sepsis without septic shock: Secondary | ICD-10-CM | POA: Diagnosis not present

## 2023-03-15 DIAGNOSIS — B964 Proteus (mirabilis) (morganii) as the cause of diseases classified elsewhere: Secondary | ICD-10-CM | POA: Diagnosis present

## 2023-03-15 LAB — GLUCOSE, CAPILLARY
Glucose-Capillary: 186 mg/dL — ABNORMAL HIGH (ref 70–99)
Glucose-Capillary: 167 mg/dL — ABNORMAL HIGH (ref 70–99)
Glucose-Capillary: 138 mg/dL — ABNORMAL HIGH (ref 70–99)
Glucose-Capillary: 140 mg/dL — ABNORMAL HIGH (ref 70–99)

## 2023-03-15 MED ORDER — POTASSIUM CHLORIDE CRYS ER 20 MEQ PO TBCR
40.0000 meq | EXTENDED_RELEASE_TABLET | Freq: Once | ORAL | Status: AC
  Administered 2023-03-15: 40 meq via ORAL
  Filled 2023-03-15: qty 2

## 2023-03-15 NOTE — Progress Notes (Signed)
Urology Inpatient Progress Note  Subjective: No acute events overnight.  She is afebrile, VSS. No a.m. labs available.  Urine culture growing gram-negative rods, blood cultures growing gram-negative rods.  On antibiotics as below. Interview today facilitated by telephone [place] language interpreter.  Today she reports feeling somewhat better with no pain.  She is having some persistent dizziness and headache.  She admits to some mild dysuria but no gross hematuria.  Anti-infectives: Anti-infectives (From admission, onward)    Start     Dose/Rate Route Frequency Ordered Stop   03/14/23 1000  cefTRIAXone (ROCEPHIN) 2 g in sodium chloride 0.9 % 100 mL IVPB        2 g 200 mL/hr over 30 Minutes Intravenous Every 24 hours 03/14/23 0039     03/13/23 2145  ceFEPIme (MAXIPIME) 2 g in sodium chloride 0.9 % 100 mL IVPB        2 g 200 mL/hr over 30 Minutes Intravenous  Once 03/13/23 2134 03/13/23 2242   03/13/23 2145  metroNIDAZOLE (FLAGYL) IVPB 500 mg        500 mg 100 mL/hr over 60 Minutes Intravenous  Once 03/13/23 2134 03/13/23 2340   03/13/23 2145  vancomycin (VANCOCIN) IVPB 1000 mg/200 mL premix        1,000 mg 200 mL/hr over 60 Minutes Intravenous  Once 03/13/23 2134 03/14/23 0033       Current Facility-Administered Medications  Medication Dose Route Frequency Provider Last Rate Last Admin   acetaminophen (TYLENOL) tablet 325 mg  325 mg Oral Q6H PRN Lorretta Harp, MD       aspirin EC tablet 81 mg  81 mg Oral Daily Lorretta Harp, MD   81 mg at 03/15/23 0840   atorvastatin (LIPITOR) tablet 80 mg  80 mg Oral Daily Lorretta Harp, MD   80 mg at 03/15/23 0839   buPROPion (WELLBUTRIN SR) 12 hr tablet 150 mg  150 mg Oral Daily Lorretta Harp, MD   150 mg at 03/15/23 0840   cefTRIAXone (ROCEPHIN) 2 g in sodium chloride 0.9 % 100 mL IVPB  2 g Intravenous Q24H Lorretta Harp, MD 200 mL/hr at 03/15/23 0849 2 g at 03/15/23 0849   gabapentin (NEURONTIN) capsule 600 mg  600 mg Oral BID Lorretta Harp, MD   600 mg at  03/15/23 0840   heparin injection 5,000 Units  5,000 Units Subcutaneous Alean Rinne, MD   5,000 Units at 03/15/23 0507   hydrALAZINE (APRESOLINE) injection 5 mg  5 mg Intravenous Q2H PRN Lorretta Harp, MD       insulin aspart (novoLOG) injection 0-5 Units  0-5 Units Subcutaneous QHS Lorretta Harp, MD   2 Units at 03/14/23 2139   insulin aspart (novoLOG) injection 0-9 Units  0-9 Units Subcutaneous TID WC Lorretta Harp, MD   2 Units at 03/15/23 0841   insulin glargine-yfgn (SEMGLEE) injection 10 Units  10 Units Subcutaneous Daily Lorretta Harp, MD   10 Units at 03/15/23 0843   levothyroxine (SYNTHROID) tablet 50 mcg  50 mcg Oral Q0600 Lorretta Harp, MD   50 mcg at 03/15/23 0508   olopatadine (PATANOL) 0.1 % ophthalmic solution 1 drop  1 drop Both Eyes QHS Lorretta Harp, MD   1 drop at 03/14/23 2141   ondansetron (ZOFRAN) injection 4 mg  4 mg Intravenous Q8H PRN Lorretta Harp, MD         Objective: Vital signs in last 24 hours: Temp:  [97.6 F (36.4 C)-99 F (37.2 C)] 97.7 F (36.5 C) (12/17  0757) Pulse Rate:  [69-109] 69 (12/17 0757) Resp:  [16-31] 18 (12/17 0515) BP: (94-132)/(47-96) 123/63 (12/17 0757) SpO2:  [54 %-100 %] 96 % (12/17 0757) Weight:  [92 kg] 92 kg (12/16 1618)  Intake/Output from previous day: 12/16 0701 - 12/17 0700 In: 725 [P.O.:225; I.V.:400; IV Piggyback:100] Out: 1002 [Urine:1000; Blood:2] Intake/Output this shift: No intake/output data recorded.  Physical Exam Vitals and nursing note reviewed.  Constitutional:      General: She is not in acute distress.    Appearance: She is ill-appearing. She is not toxic-appearing or diaphoretic.  HENT:     Head: Normocephalic and atraumatic.  Pulmonary:     Effort: Pulmonary effort is normal. No respiratory distress.  Skin:    General: Skin is warm and dry.  Neurological:     Mental Status: She is alert and oriented to person, place, and time.  Psychiatric:        Mood and Affect: Mood normal.        Behavior: Behavior normal.      Lab Results:  Recent Labs    03/13/23 2100 03/14/23 0616  WBC 19.9* 16.3*  HGB 13.3 10.5*  HCT 39.9 31.3*  PLT 141* 112*   BMET Recent Labs    03/13/23 2100 03/14/23 0616  NA 130* 136  K 3.3* 3.4*  CL 92* 102  CO2 23 24  GLUCOSE 370* 192*  BUN 65* 54*  CREATININE 1.94* 1.54*  CALCIUM 8.4* 7.7*   PT/INR Recent Labs    03/13/23 2324  LABPROT 15.2  INR 1.2   ABG Recent Labs    03/13/23 2101  HCO3 24.6    Studies/Results: DG OR UROLOGY CYSTO IMAGE ([facility] ONLY) Result Date: 03/14/2023 There is no interpretation for this exam.  This order is for images obtained during a surgical procedure.  Please See "Surgeries" Tab for more information regarding the procedure.   CT ABDOMEN PELVIS WO CONTRAST Result Date: 03/14/2023 CLINICAL DATA:  Sepsis EXAM: CT ABDOMEN AND PELVIS WITHOUT CONTRAST TECHNIQUE: Multidetector CT imaging of the abdomen and pelvis was performed following the standard protocol without IV contrast. RADIATION DOSE REDUCTION: This exam was performed according to the departmental dose-optimization program which includes automated exposure control, adjustment of the mA and/or kV according to patient size and/or use of iterative reconstruction technique. COMPARISON:  None Available. FINDINGS: Lower chest: Streaky bibasilar atelectasis and probable scarring changes. Very small bilateral pleural effusions. No pericardial effusion. Hepatobiliary: No worrisome hepatic lesions are identified without contrast. No intrahepatic biliary dilatation. Small calcified granuloma noted in the right hepatic. Gallbladder is mildly distended with maximum transverse diameter 4.8 cm. No calcified gallstones or pericholecystic inflammatory changes or fluid. Normal caliber common bile duct. Pancreas: Unremarkable. No pancreatic ductal dilatation or surrounding inflammatory changes. Spleen: Normal in size without focal abnormality. Adrenals/Urinary Tract: Adrenal glands are normal. No  renal calculi or renal mass. There is significant left-sided hydronephrosis and proximal left hydroureter down to an obstructing 6 x 5 mm calculus in the upper left ureter noted at the L3-4 disc space level. No right-sided ureteral calculi. No bladder calculi. No obvious bladder mass. Stomach/Bowel: The stomach, duodenum, small bowel and colon are grossly normal without oral contrast. No inflammatory changes, mass lesions or obstructive findings. Sigmoid colon diverticulosis. Vascular/Lymphatic: The aorta is normal in caliber. Scattered atheroscerlotic calcifications. No mesenteric of retroperitoneal mass or adenopathy. Small scattered lymph nodes are noted. Reproductive: The uterus and ovaries are unremarkable. Other: No pelvic mass or adenopathy. No free pelvic fluid  collections. No inguinal mass or adenopathy. No abdominal wall hernia or subcutaneous lesions. Musculoskeletal: No significant bony findings. IMPRESSION: 1. 6 x 5 mm obstructing upper left ureteral calculus with associated hydroureteronephrosis. 2. No other urinary tract calculi. 3. Sigmoid colon diverticulosis. 4. Mildly distended gallbladder but no calcified gallstones or pericholecystic inflammatory changes or fluid. 5. Very small bilateral pleural effusions and bibasilar atelectasis. Electronically Signed   By: Rudie Meyer M.D.   On: 03/14/2023 11:41   DG Chest Portable 1 View Result Date: 03/13/2023 CLINICAL DATA:  Weakness and hyperglycemia. EXAM: PORTABLE CHEST 1 VIEW COMPARISON:  None Available. FINDINGS: The lungs are expiratory, limiting visualization of the lower lung fields. There are atelectatic bands in both bases. The visualized lungs are otherwise clear. There is mild cardiomegaly without evidence CHF. The aorta is tortuous and mildly ectatic with scattered calcific plaques. The mediastinum otherwise unremarkable. There are osteophytes of the spine and both AC joints. No acute skeletal findings. IMPRESSION: 1. Expiratory chest  with atelectatic bands in both bases. 2. No evidence of acute chest disease, but with limited view of the lower lung zones. 3. Mild cardiomegaly without evidence of CHF. 4. Aortic atherosclerosis. Electronically Signed   By: Almira Bar M.D.   On: 03/13/2023 21:28   Assessment & Plan: 77 year old female admitted with sepsis secondary to an obstructing 6 mm proximal left ureteral stone, now POD 1 from left ureteral stent placement with Dr. Apolinar Junes.  We discussed that she will require outpatient ureteroscopy with laser lithotripsy and stent exchange with Dr. Apolinar Junes in about 3 weeks for definitive management of her stone.  We discussed that she may have dysuria and gross hematuria for the duration of her stent being in place.  Otherwise, she appears to be tolerating her stent well.  Recommendations: -Continue IV antibiotics and follow cultures.  She will require a total of 14 days of culture appropriate therapy -Consider Flomax 0.4 mg daily and oxybutynin 5 mg every 8 hours as needed for stent discomfort -Outpatient left ureteroscopy with laser lithotripsy and stent exchange with Dr. Apolinar Junes in about 3 weeks, our scheduler will contact the patient after discharge to get it scheduled  Carman Ching, PA-C 03/15/2023

## 2023-03-15 NOTE — Plan of Care (Signed)

## 2023-03-15 NOTE — Progress Notes (Addendum)
Progress Note    Kelsey Wise  ONG:[id number] DOB: 1946/01/31  DOA: 03/13/2023 PCP: Hillery Aldo, MD      Brief Narrative:    Medical records reviewed and are as summarized below:  Kelsey Wise is a 77 y.o. female with medical history significant of HLD, pre-DM, hypothyroidism, carpal tunnel syndrome, back pain, arthritis,, who presented to the hospital with general weakness, dysuria and lower abdominal pain for 4 days duration.  She was found to have severe sepsis secondary to acute complicated UTI and left ureteral calculus.  WBC was 19.9 lactic acid was 3.5, creatinine 1.94, potassium 3.3, troponin 127 on admission.     Assessment/Plan:   Principal Problem:   Severe sepsis (HCC) Active Problems:   UTI (urinary tract infection)   Myocardial injury   Abnormal LFTs   AKI (acute kidney injury) (HCC)   HLD (hyperlipidemia)   Hypothyroidism   Hypokalemia   Prediabetes   HTN (hypertension)   Bacteremia due to Proteus species    Body mass index is 42.39 kg/m.  (Morbid obesity)   Severe sepsis secondary to acute complicated Proteus UTI and Proteus bacteremia: Blood and urine cultures showed Proteus species.   Obstructing left ureteral calculus, left hydroureteronephrosis: S/p left ureteral stent on 03/14/2023.  Follow-up with urologist.   AKI: Improving.  Monitor creatinine off of IV fluids.   Hypokalemia: Replete potassium   Elevated troponins: Peak troponin was 154.  This was attributed to demand ischemia from severe sepsis.   Type 2 diabetes mellitus: Continue Semglee and NovoLog as needed.  Hemoglobin A1c 7.5. Jardiance and metformin have been held   Number cytopenia: Platelet count is trending down.  This may be due to sepsis.  Continue to monitor.   Elevated liver enzymes: Improved Hyponatremia: Improved   Other medical problems include hypothyroidism, hyperlipidemia, hypertension, arthritis   This encounter was completed using [place]  interpretation service via iPad.   Diet Order             Diet NPO time specified  Diet effective now                            Consultants: Urologist  Procedures: Left ureteral stent placement on 03/14/2023    Medications:    aspirin EC  81 mg Oral Daily   atorvastatin  80 mg Oral Daily   buPROPion  150 mg Oral Daily   gabapentin  600 mg Oral BID   heparin  5,000 Units Subcutaneous Q8H   insulin aspart  0-5 Units Subcutaneous QHS   insulin aspart  0-9 Units Subcutaneous TID WC   insulin glargine-yfgn  10 Units Subcutaneous Daily   levothyroxine  50 mcg Oral Q0600   olopatadine  1 drop Both Eyes QHS   Continuous Infusions:  cefTRIAXone (ROCEPHIN)  IV 2 g (03/15/23 0849)     Anti-infectives (From admission, onward)    Start     Dose/Rate Route Frequency Ordered Stop   03/14/23 1000  cefTRIAXone (ROCEPHIN) 2 g in sodium chloride 0.9 % 100 mL IVPB        2 g 200 mL/hr over 30 Minutes Intravenous Every 24 hours 03/14/23 0039     03/13/23 2145  ceFEPIme (MAXIPIME) 2 g in sodium chloride 0.9 % 100 mL IVPB        2 g 200 mL/hr over 30 Minutes Intravenous  Once 03/13/23 2134 03/13/23 2242   03/13/23 2145  metroNIDAZOLE (FLAGYL) IVPB  500 mg        500 mg 100 mL/hr over 60 Minutes Intravenous  Once 03/13/23 2134 03/13/23 2340   03/13/23 2145  vancomycin (VANCOCIN) IVPB 1000 mg/200 mL premix        1,000 mg 200 mL/hr over 60 Minutes Intravenous  Once 03/13/23 2134 03/14/23 0033              Family Communication/Anticipated D/C date and plan/Code Status   DVT prophylaxis: heparin injection 5,000 Units Start: 03/14/23 0600     Code Status: Full Code  Family Communication: None Disposition Plan: Plan to discharge home   Status is: Inpatient Remains inpatient appropriate because: UTI and bacteremia       Subjective:   Interval events noted.  She complains of lower abdominal pain.  She also complains of pain in her  feet/toes  Objective:    Vitals:   03/15/23 0115 03/15/23 0515 03/15/23 0757 03/15/23 1606  BP: 132/64 (!) 113/56 123/63 (!) 126/52  Pulse: 72 72 69 68  Resp: 17 18    Temp: 98.3 F (36.8 C) 97.6 F (36.4 C) 97.7 F (36.5 C) 97.8 F (36.6 C)  TempSrc: Oral Oral Oral Oral  SpO2: 98% 96% 96% 100%  Weight:      Height:       No data found.   Intake/Output Summary (Last 24 hours) at 03/15/2023 1607 Last data filed at 03/15/2023 1539 Gross per 24 hour  Intake 924.04 ml  Output 1002 ml  Net -77.96 ml   Filed Weights   03/14/23 1618  Weight: 92 kg    Exam:  GEN: NAD SKIN: No rash EYES: No pallor or icterus ENT: MMM CV: RRR PULM: CTA B ABD: soft, obese, suprapubic tenderness, no rebound tenderness or guarding, +BS CNS: AAO x 3, non focal EXT: No edema or tenderness        Data Reviewed:   I have personally reviewed following labs and imaging studies:  Labs: Labs show the following:   Basic Metabolic Panel: Recent Labs  Lab 03/13/23 2100 03/13/23 2324 03/14/23 0616 03/14/23 1001  NA 130*  --  136  --   K 3.3*  --  3.4*  --   CL 92*  --  102  --   CO2 23  --  24  --   GLUCOSE 370*  --  192*  --   BUN 65*  --  54*  --   CREATININE 1.94*  --  1.54*  --   CALCIUM 8.4*  --  7.7*  --   MG 2.1  --   --  2.1  PHOS  --  3.2  --   --    GFR Estimated Creatinine Clearance: 29.6 mL/min (A) (by C-G formula based on SCr of 1.54 mg/dL (H)). Liver Function Tests: Recent Labs  Lab 03/13/23 2100 03/14/23 0616  AST 49* 32  ALT 36 27  ALKPHOS 140* 104  BILITOT 1.9* 1.0  PROT 7.6 5.9*  ALBUMIN 3.2* 2.4*   No results for input(s): "LIPASE", "AMYLASE" in the last 168 hours. No results for input(s): "AMMONIA" in the last 168 hours. Coagulation profile Recent Labs  Lab 03/13/23 2324  INR 1.2    CBC: Recent Labs  Lab 03/13/23 2100 03/14/23 0616  WBC 19.9* 16.3*  HGB 13.3 10.5*  HCT 39.9 31.3*  MCV 89.5 89.2  PLT 141* 112*   Cardiac  Enzymes: No results for input(s): "CKTOTAL", "CKMB", "CKMBINDEX", "TROPONINI" in the last 168 hours.  BNP (last 3 results) No results for input(s): "PROBNP" in the last 8760 hours. CBG: Recent Labs  Lab 03/14/23 1757 03/14/23 2114 03/15/23 0801 03/15/23 1221 03/15/23 1554  GLUCAP 178* 237* 186* 167* 138*   D-Dimer: No results for input(s): "DDIMER" in the last 72 hours. Hgb A1c: Recent Labs    03/13/23 2100  HGBA1C 7.5*   Lipid Profile: Recent Labs    03/13/23 2100  CHOL 136  HDL <10*  LDLCALC UNABLE TO CALCULATE IF TRIGLYCERIDE OVER 400 mg/dL  TRIG 098*  LDLDIRECT 40   Thyroid function studies: No results for input(s): "TSH", "T4TOTAL", "T3FREE", "THYROIDAB" in the last 72 hours.  Invalid input(s): "FREET3" Anemia work up: No results for input(s): "VITAMINB12", "FOLATE", "FERRITIN", "TIBC", "IRON", "RETICCTPCT" in the last 72 hours. Sepsis Labs: Recent Labs  Lab 03/13/23 2100 03/13/23 2324 03/14/23 0616 03/14/23 2015  PROCALCITON 30.16  --   --   --   WBC 19.9*  --  16.3*  --   LATICACIDVEN 3.5* 2.4*  --  0.8    Microbiology Recent Results (from the past 240 hours)  Culture, blood (routine x 2)     Status: Abnormal (Preliminary result)   Collection Time: 03/13/23  9:00 PM   Specimen: BLOOD RIGHT ARM  Result Value Ref Range Status   Specimen Description   Final    BLOOD RIGHT ARM Performed at [facility], [address] [place], [place] [address]    Special Requests   Final    BOTTLES DRAWN AEROBIC AND ANAEROBIC Blood Culture results may not be optimal due to an inadequate volume of blood received in culture bottles Performed at [facility], [address] [place], [place] [address]    Culture  Setup Time   Final    GRAM NEGATIVE RODS IN BOTH AEROBIC AND ANAEROBIC BOTTLES Organism ID to follow CRITICAL RESULT CALLED TO, READ BACK BY AND VERIFIED WITH: WALID NAZARI PHARMD [id number] 03/14/23 HNM GRAM STAIN REVIEWED-AGREE WITH RESULT  DRT    Culture (A)  Final    PROTEUS MIRABILIS SUSCEPTIBILITIES TO FOLLOW Performed at [facility], [address] [place], [place] [address]    Report Status PENDING  Incomplete  Blood Culture ID Panel (Reflexed)     Status: Abnormal   Collection Time: 03/13/23  9:00 PM  Result Value Ref Range Status   Enterococcus faecalis NOT DETECTED NOT DETECTED Final   Enterococcus Faecium NOT DETECTED NOT DETECTED Final   Listeria monocytogenes NOT DETECTED NOT DETECTED Final   Staphylococcus species NOT DETECTED NOT DETECTED Final   Staphylococcus aureus (BCID) NOT DETECTED NOT DETECTED Final   Staphylococcus epidermidis NOT DETECTED NOT DETECTED Final   Staphylococcus lugdunensis NOT DETECTED NOT DETECTED Final   Streptococcus species NOT DETECTED NOT DETECTED Final   Streptococcus agalactiae NOT DETECTED NOT DETECTED Final   Streptococcus pneumoniae NOT DETECTED NOT DETECTED Final   Streptococcus pyogenes NOT DETECTED NOT DETECTED Final   A.calcoaceticus-baumannii NOT DETECTED NOT DETECTED Final   Bacteroides fragilis NOT DETECTED NOT DETECTED Final   Enterobacterales DETECTED (A) NOT DETECTED Final    Comment: Enterobacterales represent a large order of gram negative bacteria, not a single organism. CRITICAL RESULT CALLED TO, READ BACK BY AND VERIFIED WITH: Mila Merry PHARMD 1008 03/14/23 HNM    Enterobacter cloacae complex NOT DETECTED NOT DETECTED Final   Escherichia coli NOT DETECTED NOT DETECTED Final   Klebsiella aerogenes NOT DETECTED NOT DETECTED Final   Klebsiella oxytoca NOT DETECTED NOT DETECTED  Final   Klebsiella pneumoniae NOT DETECTED NOT DETECTED Final   Proteus species DETECTED (A) NOT DETECTED Final    Comment: CRITICAL RESULT CALLED TO, READ BACK BY AND VERIFIED WITH: Mila Merry PHARMD 1008 03/14/23 HNM    Salmonella species NOT DETECTED NOT DETECTED Final   Serratia marcescens NOT DETECTED NOT DETECTED Final   Haemophilus influenzae NOT DETECTED NOT  DETECTED Final   Neisseria meningitidis NOT DETECTED NOT DETECTED Final   Pseudomonas aeruginosa NOT DETECTED NOT DETECTED Final   Stenotrophomonas maltophilia NOT DETECTED NOT DETECTED Final   Candida albicans NOT DETECTED NOT DETECTED Final   Candida auris NOT DETECTED NOT DETECTED Final   Candida glabrata NOT DETECTED NOT DETECTED Final   Candida krusei NOT DETECTED NOT DETECTED Final   Candida parapsilosis NOT DETECTED NOT DETECTED Final   Candida tropicalis NOT DETECTED NOT DETECTED Final   Cryptococcus neoformans/gattii NOT DETECTED NOT DETECTED Final   CTX-M ESBL NOT DETECTED NOT DETECTED Final   Carbapenem resistance IMP NOT DETECTED NOT DETECTED Final   Carbapenem resistance KPC NOT DETECTED NOT DETECTED Final   Carbapenem resistance NDM NOT DETECTED NOT DETECTED Final   Carbapenem resist OXA 48 LIKE NOT DETECTED NOT DETECTED Final   Carbapenem resistance VIM NOT DETECTED NOT DETECTED Final    Comment: Performed at [facility], [address] [place], [place] [address]  Culture, blood (routine x 2)     Status: Abnormal (Preliminary result)   Collection Time: 03/13/23  9:02 PM   Specimen: BLOOD RIGHT ARM  Result Value Ref Range Status   Specimen Description   Final    BLOOD RIGHT ARM Performed at [facility], [address] [place], [place] [address]    Special Requests   Final    BOTTLES DRAWN AEROBIC AND ANAEROBIC Blood Culture results may not be optimal due to an inadequate volume of blood received in culture bottles Performed at [facility], [address] [place], [place] [address]    Culture  Setup Time   Final    GRAM NEGATIVE RODS IN BOTH AEROBIC AND ANAEROBIC BOTTLES CRITICAL VALUE NOTED.  VALUE IS CONSISTENT WITH PREVIOUSLY REPORTED AND CALLED VALUE. Performed at [facility], [address] [place], [place] [address]    Culture PROTEUS MIRABILIS (A)  Final   Report Status PENDING  Incomplete  Urine Culture      Status: Abnormal (Preliminary result)   Collection Time: 03/13/23 11:24 PM   Specimen: Urine, Random  Result Value Ref Range Status   Specimen Description   Final    URINE, RANDOM Performed at [facility], [address] [place], [place] [address]    Special Requests   Final    NONE Performed at [facility], [address], [place], [place] [address]    Culture >=100,000 COLONIES/mL PROTEUS MIRABILIS (A)  Final   Report Status PENDING  Incomplete    Procedures and diagnostic studies:  DG OR UROLOGY CYSTO IMAGE ([facility] ONLY) Result Date: 03/14/2023 There is no interpretation for this exam.  This order is for images obtained during a surgical procedure.  Please See "Surgeries" Tab for more information regarding the procedure.   CT ABDOMEN PELVIS WO CONTRAST Result Date: 03/14/2023 CLINICAL DATA:  Sepsis EXAM: CT ABDOMEN AND PELVIS WITHOUT CONTRAST TECHNIQUE: Multidetector CT imaging of the abdomen and pelvis was performed following the standard protocol without IV contrast. RADIATION DOSE REDUCTION: This exam was performed according to the departmental dose-optimization program which includes  automated exposure control, adjustment of the mA and/or kV according to patient size and/or use of iterative reconstruction technique. COMPARISON:  None Available. FINDINGS: Lower chest: Streaky bibasilar atelectasis and probable scarring changes. Very small bilateral pleural effusions. No pericardial effusion. Hepatobiliary: No worrisome hepatic lesions are identified without contrast. No intrahepatic biliary dilatation. Small calcified granuloma noted in the right hepatic. Gallbladder is mildly distended with maximum transverse diameter 4.8 cm. No calcified gallstones or pericholecystic inflammatory changes or fluid. Normal caliber common bile duct. Pancreas: Unremarkable. No pancreatic ductal dilatation or surrounding inflammatory changes. Spleen: Normal in size without focal  abnormality. Adrenals/Urinary Tract: Adrenal glands are normal. No renal calculi or renal mass. There is significant left-sided hydronephrosis and proximal left hydroureter down to an obstructing 6 x 5 mm calculus in the upper left ureter noted at the L3-4 disc space level. No right-sided ureteral calculi. No bladder calculi. No obvious bladder mass. Stomach/Bowel: The stomach, duodenum, small bowel and colon are grossly normal without oral contrast. No inflammatory changes, mass lesions or obstructive findings. Sigmoid colon diverticulosis. Vascular/Lymphatic: The aorta is normal in caliber. Scattered atheroscerlotic calcifications. No mesenteric of retroperitoneal mass or adenopathy. Small scattered lymph nodes are noted. Reproductive: The uterus and ovaries are unremarkable. Other: No pelvic mass or adenopathy. No free pelvic fluid collections. No inguinal mass or adenopathy. No abdominal wall hernia or subcutaneous lesions. Musculoskeletal: No significant bony findings. IMPRESSION: 1. 6 x 5 mm obstructing upper left ureteral calculus with associated hydroureteronephrosis. 2. No other urinary tract calculi. 3. Sigmoid colon diverticulosis. 4. Mildly distended gallbladder but no calcified gallstones or pericholecystic inflammatory changes or fluid. 5. Very small bilateral pleural effusions and bibasilar atelectasis. Electronically Signed   By: Rudie Meyer M.D.   On: 03/14/2023 11:41   DG Chest Portable 1 View Result Date: 03/13/2023 CLINICAL DATA:  Weakness and hyperglycemia. EXAM: PORTABLE CHEST 1 VIEW COMPARISON:  None Available. FINDINGS: The lungs are expiratory, limiting visualization of the lower lung fields. There are atelectatic bands in both bases. The visualized lungs are otherwise clear. There is mild cardiomegaly without evidence CHF. The aorta is tortuous and mildly ectatic with scattered calcific plaques. The mediastinum otherwise unremarkable. There are osteophytes of the spine and both AC  joints. No acute skeletal findings. IMPRESSION: 1. Expiratory chest with atelectatic bands in both bases. 2. No evidence of acute chest disease, but with limited view of the lower lung zones. 3. Mild cardiomegaly without evidence of CHF. 4. Aortic atherosclerosis. Electronically Signed   By: Almira Bar M.D.   On: 03/13/2023 21:28               LOS: 2 days   Shamir Tuzzolino  [facility]   Pager on [email or web address]. If 7PM-7AM, please contact night-coverage at [email or web address]     03/15/2023, 4:07 PM

## 2023-03-15 NOTE — TOC CM/SW Note (Signed)
Transition of Care [facility]) - Inpatient Brief Assessment   Patient Details  Name: Kelsey Wise MRN: [id number] Date of Birth: 1946/03/22  Transition [facility] CM/SW Contact:    Chapman Fitch, RN Phone Number: 03/15/2023, 10:04 AM   Clinical Narrative:   Transition of Care (TOC) Screening Note   Patient Details  Name: Kelsey Wise Date of Birth: 1946/01/08   Transition of Care [facility]) CM/SW Contact:    Chapman Fitch, RN Phone Number: 03/15/2023, 10:04 AM    Transition of Care Department [facility]) has reviewed patient and no TOC needs have been identified at this time.  If new patient transition needs arise, please place a TOC consult.    Transition of Care Asessment: Insurance and Status: Insurance coverage has been reviewed Patient has primary care physician: Yes     Prior/Current Home Services: No current home services Social Drivers of Health Review: SDOH reviewed no interventions necessary Readmission risk has been reviewed: Yes Transition of care needs: no transition of care needs at this time

## 2023-03-16 ENCOUNTER — Other Ambulatory Visit: Payer: Self-pay

## 2023-03-16 ENCOUNTER — Inpatient Hospital Stay: Payer: Medicare Other

## 2023-03-16 DIAGNOSIS — N201 Calculus of ureter: Secondary | ICD-10-CM

## 2023-03-16 DIAGNOSIS — A419 Sepsis, unspecified organism: Secondary | ICD-10-CM | POA: Diagnosis not present

## 2023-03-16 DIAGNOSIS — R6520 Severe sepsis without septic shock: Secondary | ICD-10-CM | POA: Diagnosis not present

## 2023-03-16 LAB — BASIC METABOLIC PANEL
Anion gap: 9 (ref 5–15)
BUN: 50 mg/dL — ABNORMAL HIGH (ref 8–23)
CO2: 20 mmol/L — ABNORMAL LOW (ref 22–32)
Calcium: 8.3 mg/dL — ABNORMAL LOW (ref 8.9–10.3)
Chloride: 108 mmol/L (ref 98–111)
Creatinine, Ser: 1.1 mg/dL — ABNORMAL HIGH (ref 0.44–1.00)
GFR, Estimated: 52 mL/min — ABNORMAL LOW (ref >60)
Glucose, Bld: 105 mg/dL — ABNORMAL HIGH (ref 70–99)
Potassium: 4 mmol/L (ref 3.5–5.1)
Sodium: 137 mmol/L (ref 135–145)

## 2023-03-16 LAB — CBC WITH DIFFERENTIAL/PLATELET
Abs Immature Granulocytes: 0.69 10*3/uL — ABNORMAL HIGH (ref 0.00–0.07)
Basophils Absolute: 0.1 10*3/uL (ref 0.0–0.1)
Basophils Relative: 1 %
Eosinophils Absolute: 0 10*3/uL (ref 0.0–0.5)
Eosinophils Relative: 0 %
HCT: 33.9 % — ABNORMAL LOW (ref 36.0–46.0)
Hemoglobin: 11 g/dL — ABNORMAL LOW (ref 12.0–15.0)
Immature Granulocytes: 5 %
Lymphocytes Relative: 11 %
Lymphs Abs: 1.4 10*3/uL (ref 0.7–4.0)
MCH: 29.4 pg (ref 26.0–34.0)
MCHC: 32.4 g/dL (ref 30.0–36.0)
MCV: 90.6 fL (ref 80.0–100.0)
Monocytes Absolute: 0.7 10*3/uL (ref 0.1–1.0)
Monocytes Relative: 5 %
Neutro Abs: 10.4 10*3/uL — ABNORMAL HIGH (ref 1.7–7.7)
Neutrophils Relative %: 78 %
Platelets: 137 10*3/uL — ABNORMAL LOW (ref 150–400)
RBC: 3.74 MIL/uL — ABNORMAL LOW (ref 3.87–5.11)
RDW: 14.1 % (ref 11.5–15.5)
WBC: 13.3 10*3/uL — ABNORMAL HIGH (ref 4.0–10.5)
nRBC: 0 % (ref 0.0–0.2)

## 2023-03-16 LAB — CULTURE, BLOOD (ROUTINE X 2)

## 2023-03-16 LAB — GLUCOSE, CAPILLARY
Glucose-Capillary: 84 mg/dL (ref 70–99)
Glucose-Capillary: 44 mg/dL — CL (ref 70–99)
Glucose-Capillary: 81 mg/dL (ref 70–99)
Glucose-Capillary: 150 mg/dL — ABNORMAL HIGH (ref 70–99)
Glucose-Capillary: 175 mg/dL — ABNORMAL HIGH (ref 70–99)

## 2023-03-16 LAB — URINE CULTURE: Culture: >=100000 — AB

## 2023-03-16 LAB — URIC ACID: Uric Acid, Serum: 7.5 mg/dL — ABNORMAL HIGH (ref 2.5–7.1)

## 2023-03-16 MED ORDER — LEVOFLOXACIN 250 MG PO TABS: 250.0000 mg | ORAL_TABLET | Freq: Every day | ORAL | Status: DC

## 2023-03-16 MED ORDER — AMOXICILLIN 500 MG PO CAPS
1000.0000 mg | ORAL_CAPSULE | Freq: Three times a day (TID) | ORAL | Status: DC
  Administered 2023-03-17 (×2): 1000 mg via ORAL
  Filled 2023-03-16 (×2): qty 2

## 2023-03-16 MED ORDER — LEVOFLOXACIN 500 MG PO TABS
500.0000 mg | ORAL_TABLET | Freq: Once | ORAL | Status: DC
  Filled 2023-03-16: qty 1

## 2023-03-16 NOTE — Plan of Care (Signed)
  Problem: Metabolic: Goal: Ability to maintain appropriate glucose levels will improve Outcome: Progressing   

## 2023-03-16 NOTE — Evaluation (Signed)
Physical Therapy Evaluation Patient Details Name: Kelsey Wise MRN: [id number] DOB: [birth year] Today's Date: 03/16/2023  History of Present Illness  [name] who [facility] with acute onset weakness, difficulty moving, admitted with sepsis 2/2 UTI.  Clinical Impression  Pt on phone on arrival, agreeable to session- utilized [employer] [phone]. Pt grossly fatigued, tired but able to perform some basic mobility today with success. Pt most weak during bed mobility wherein she requires modA to come to EOB. Pt weak, slow and steady with transfers and taking steps along the counter. Pt set up in chair at end of session, but then staff arrives to take pt OTF for imaging. Will continue to follow.       If plan is discharge home, recommend the following: A little help with walking and/or transfers;A little help with bathing/dressing/bathroom   Can travel by private vehicle        Equipment Recommendations Rolling walker (2 wheels)  Recommendations for Other Services       Functional Status Assessment Patient has had a recent decline in their functional status and demonstrates the ability to make significant improvements in function in a reasonable and predictable amount of time.     Precautions / Restrictions Precautions Precautions: Fall Restrictions Weight Bearing Restrictions Per Provider Order: No      Mobility  Bed Mobility Overal bed mobility: Needs Assistance Bed Mobility: Supine to Sit, Rolling Rolling: Modified independent (Device/Increase time)   Supine to sit: Mod assist          Transfers Overall transfer level: Needs assistance Equipment used: None Transfers: Sit to/from Stand Sit to Stand: Supervision                Ambulation/Gait Ambulation/Gait assistance: Supervision Gait Distance (Feet): 20 Feet Assistive device:  (countertop)         General Gait Details: side stepping along counter, than sits in recliner; reports grossly  tired  Stairs            Wheelchair Mobility     Tilt Bed    Modified Rankin (Stroke Patients Only)       Balance                                             Pertinent Vitals/Pain Pain Assessment Pain Assessment:  (some Right low back pain earlier near Rt PSIS, she says is chornic, now improved with meds earlier today)    Home Living Family/patient expects to be discharged to:: Private residence Living Arrangements: Children;Spouse/significant other Available Help at Discharge: Family Type of Home: House           Home Equipment: None      Prior Function Prior Level of Function : Driving             Mobility Comments: typical does home care, cares for self       Extremity/Trunk Assessment                Communication      Cognition Arousal: Alert Behavior During Therapy: WFL for tasks assessed/performed Overall Cognitive Status: Within Functional Limits for tasks assessed  General Comments      Exercises     Assessment/Plan    PT Assessment Patient needs continued PT services  PT Problem List Decreased strength;Decreased range of motion;Decreased activity tolerance;Decreased balance;Decreased mobility;Decreased coordination       PT Treatment Interventions DME instruction;Gait training;Stair training;Functional mobility training;Therapeutic activities;Therapeutic exercise;Balance training    PT Goals (Current goals can be found in the Care Plan section)  Acute Rehab PT Goals Patient Stated Goal: regain strength PT Goal Formulation: With patient Time For Goal Achievement: 03/30/23 Potential to Achieve Goals: Good    Frequency Min 1X/week     Co-evaluation               AM-PAC PT "6 Clicks" Mobility  Outcome Measure Help needed turning from your back to your side while in a flat bed without using bedrails?: A Little Help needed moving from  lying on your back to sitting on the side of a flat bed without using bedrails?: A Little Help needed moving to and from a bed to a chair (including a wheelchair)?: A Little Help needed standing up from a chair using your arms (e.g., wheelchair or bedside chair)?: A Little Help needed to walk in hospital room?: A Little Help needed climbing 3-5 steps with a railing? : A Little 6 Click Score: 18    End of Session   Activity Tolerance: Patient limited by fatigue;No increased pain Patient left: Other (comment) (tranposrt chair, pt taken OTF for imaging) Nurse Communication: Mobility status PT Visit Diagnosis: Other abnormalities of gait and mobility (R26.89);Muscle weakness (generalized) (M62.81)    Time: 1610-9604 PT Time Calculation (min) (ACUTE ONLY): 19 min   Charges:   PT Evaluation $PT Eval Low Complexity: 1 Low   PT General Charges $$ ACUTE PT VISIT: 1 Visit        4:37 PM, 03/16/23 Rosamaria Lints, PT, DPT Physical Therapist - [facility]  [phone] (ASCOM)    Elliette Seabolt C 03/16/2023, 4:30 PM

## 2023-03-16 NOTE — Inpatient Diabetes Management (Signed)
Inpatient Diabetes Program Recommendations  AACE/ADA: New Consensus Statement on Inpatient Glycemic Control   Target Ranges:  Prepandial:   less than 140 mg/dL      Peak postprandial:   less than 180 mg/dL (1-2 hours)      Critically ill patients:  140 - 180 mg/dL    Latest Reference Range & Units 03/16/23 08:34 03/16/23 11:40 03/16/23 12:03  Glucose-Capillary 70 - 99 mg/dL 84 44 (LL) 81    Latest Reference Range & Units 03/15/23 08:01 03/15/23 12:21 03/15/23 15:54 03/15/23 21:46  Glucose-Capillary 70 - 99 mg/dL 027 (H) 253 (H) 664 (H) 140 (H)   Review of Glycemic Control  Diabetes history: DM2 Outpatient Diabetes medications: Jardiance 10 mg daily, Metformin XR 500 mg QAM Current orders for Inpatient glycemic control: Semglee 10 units daily, Novolog 0-9 units TID with meals, Novolog 0-5 units QHS  Inpatient Diabetes Program Recommendations:    Insulin: Fasting CBG 84 mg/dl today. Patient received Semglee 10 units at 11:17 am today and glucose down to 44 mg/dl at 40:34. Please consider discontinuing Semglee at this time.  Thanks, Orlando Penner, RN, MSN, CDCES Diabetes Coordinator Inpatient Diabetes Program [phone] (Team Pager from 8am to 5pm)

## 2023-03-16 NOTE — Progress Notes (Signed)
Progress Note    Kelsey Wise  FAO:[id number] DOB: Apr 10, 1945  DOA: 03/13/2023 PCP: Hillery Aldo, MD      Brief Narrative:    Medical records reviewed and are as summarized below:  Kelsey Wise is a 77 y.o. female with medical history significant of HLD, pre-DM, hypothyroidism, carpal tunnel syndrome, back pain, arthritis,, who presented to the hospital with general weakness, dysuria and lower abdominal pain for 4 days duration.  She was found to have severe sepsis secondary to acute complicated UTI and left ureteral calculus.  WBC was 19.9 lactic acid was 3.5, creatinine 1.94, potassium 3.3, troponin 127 on admission.     Assessment/Plan:   Principal Problem:   Severe sepsis (HCC) Active Problems:   UTI (urinary tract infection)   Myocardial injury   Abnormal LFTs   AKI (acute kidney injury) (HCC)   HLD (hyperlipidemia)   Hypothyroidism   Hypokalemia   Prediabetes   HTN (hypertension)   Bacteremia due to Proteus species    Body mass index is 42.39 kg/m.  (Morbid obesity)   Severe sepsis secondary to acute complicated Proteus UTI and Proteus bacteremia: Blood and urine cultures showed Proteus species.  Switch IV ceftriaxone to amoxicillin for 10 more days to complete 14 days of antibiotics.   Obstructing left ureteral calculus, left hydroureteronephrosis: S/p left ureteral stent on 03/14/2023.  Follow-up with Dr. Apolinar Junes, urologist, as an outpatient in 3 weeks for left uteroscopy with laser lithotripsy and stent exchange..   AKI: Improving.  Baseline creatinine is unknown.   Hypokalemia: Improved   Low back pain: X-ray lumbar spine has been ordered for further evaluation.  PT evaluation.   Elevated troponins: Peak troponin was 154.  This was attributed to demand ischemia from severe sepsis.   Type 2 diabetes mellitus, hypoglycemia: Glucose dropped to 44 on 03/16/2023 at 11:40 AM.  Discontinue Semglee.  Continue NovoLog as needed for  hyperglycemia.  Hemoglobin A1c 7.5. Jardiance and metformin have been held   Thrombocytopenia: Platelet count is starting to go up.  This may be due to sepsis.  Continue to monitor.   Elevated liver enzymes: Improved Hyponatremia: Improved   Other medical problems include hypothyroidism, hyperlipidemia, hypertension, arthritis    [place] interpretation service via iPad was used for this encounter.  Diet Order             Diet Carb Modified Fluid consistency: Thin  Diet effective now                            Consultants: Urologist  Procedures: Left ureteral stent placement on 03/14/2023    Medications:    [START ON 03/17/2023] amoxicillin  1,000 mg Oral Q8H   aspirin EC  81 mg Oral Daily   atorvastatin  80 mg Oral Daily   buPROPion  150 mg Oral Daily   gabapentin  600 mg Oral BID   heparin  5,000 Units Subcutaneous Q8H   insulin aspart  0-5 Units Subcutaneous QHS   insulin aspart  0-9 Units Subcutaneous TID WC   levothyroxine  50 mcg Oral Q0600   olopatadine  1 drop Both Eyes QHS   Continuous Infusions:     Anti-infectives (From admission, onward)    Start     Dose/Rate Route Frequency Ordered Stop   03/17/23 1000  levofloxacin (LEVAQUIN) tablet 250 mg  Status:  Discontinued        250 mg Oral Daily 03/16/23 1253 03/16/23  1303   03/17/23 0600  amoxicillin (AMOXIL) capsule 1,000 mg        1,000 mg Oral Every 8 hours 03/16/23 1303 03/28/23 0559   03/16/23 1400  levofloxacin (LEVAQUIN) tablet 500 mg  Status:  Discontinued        500 mg Oral  Once 03/16/23 1233 03/16/23 1303   03/14/23 1000  cefTRIAXone (ROCEPHIN) 2 g in sodium chloride 0.9 % 100 mL IVPB  Status:  Discontinued        2 g 200 mL/hr over 30 Minutes Intravenous Every 24 hours 03/14/23 0039 03/16/23 1233   03/13/23 2145  ceFEPIme (MAXIPIME) 2 g in sodium chloride 0.9 % 100 mL IVPB        2 g 200 mL/hr over 30 Minutes Intravenous  Once 03/13/23 2134 03/13/23 2242   03/13/23 2145   metroNIDAZOLE (FLAGYL) IVPB 500 mg        500 mg 100 mL/hr over 60 Minutes Intravenous  Once 03/13/23 2134 03/13/23 2340   03/13/23 2145  vancomycin (VANCOCIN) IVPB 1000 mg/200 mL premix        1,000 mg 200 mL/hr over 60 Minutes Intravenous  Once 03/13/23 2134 03/14/23 0033              Family Communication/Anticipated D/C date and plan/Code Status   DVT prophylaxis: heparin injection 5,000 Units Start: 03/14/23 0600     Code Status: Full Code  Family Communication: None Disposition Plan: Plan to discharge home   Status is: Inpatient Remains inpatient appropriate because: UTI and bacteremia       Subjective:   Interval events noted.  She complains of pain in the right side of her lower back.  Objective:    Vitals:   03/15/23 1606 03/15/23 1942 03/16/23 0335 03/16/23 0832  BP: (!) 126/52 103/81 (!) 123/54 (!) 99/45  Pulse: 68 73 70 71  Resp:  16 16   Temp: 97.8 F (36.6 C) (!) 97.5 F (36.4 C) (!) 97.5 F (36.4 C) (!) 97.5 F (36.4 C)  TempSrc: Oral Oral Oral Oral  SpO2: 100% 98% 100% 98%  Weight:      Height:       No data found.   Intake/Output Summary (Last 24 hours) at 03/16/2023 1304 Last data filed at 03/15/2023 1539 Gross per 24 hour  Intake 199.04 ml  Output --  Net 199.04 ml   Filed Weights   03/14/23 1618  Weight: 92 kg    Exam:  GEN: NAD SKIN: Warm and dry EYES: No pallor or icterus ENT: MMM CV: RRR PULM: CTA B ABD: soft, obese, NT, +BS CNS: AAO x 3, non focal EXT: No edema or tenderness MSK: Lumbar and right paraspinal lumbar tenderness      Data Reviewed:   I have personally reviewed following labs and imaging studies:  Labs: Labs show the following:   Basic Metabolic Panel: Recent Labs  Lab 03/13/23 2100 03/13/23 2324 03/14/23 0616 03/14/23 1001 03/16/23 0558  NA 130*  --  136  --  137  K 3.3*  --  3.4*  --  4.0  CL 92*  --  102  --  108  CO2 23  --  24  --  20*  GLUCOSE 370*  --  192*  --  105*   BUN 65*  --  54*  --  50*  CREATININE 1.94*  --  1.54*  --  1.10*  CALCIUM 8.4*  --  7.7*  --  8.3*  MG  2.1  --   --  2.1  --   PHOS  --  3.2  --   --   --    GFR Estimated Creatinine Clearance: 41.4 mL/min (A) (by C-G formula based on SCr of 1.1 mg/dL (H)). Liver Function Tests: Recent Labs  Lab 03/13/23 2100 03/14/23 0616  AST 49* 32  ALT 36 27  ALKPHOS 140* 104  BILITOT 1.9* 1.0  PROT 7.6 5.9*  ALBUMIN 3.2* 2.4*   No results for input(s): "LIPASE", "AMYLASE" in the last 168 hours. No results for input(s): "AMMONIA" in the last 168 hours. Coagulation profile Recent Labs  Lab 03/13/23 2324  INR 1.2    CBC: Recent Labs  Lab 03/13/23 2100 03/14/23 0616 03/16/23 0558  WBC 19.9* 16.3* 13.3*  NEUTROABS  --   --  10.4*  HGB 13.3 10.5* 11.0*  HCT 39.9 31.3* 33.9*  MCV 89.5 89.2 90.6  PLT 141* 112* 137*   Cardiac Enzymes: No results for input(s): "CKTOTAL", "CKMB", "CKMBINDEX", "TROPONINI" in the last 168 hours. BNP (last 3 results) No results for input(s): "PROBNP" in the last 8760 hours. CBG: Recent Labs  Lab 03/15/23 1554 03/15/23 2146 03/16/23 0834 03/16/23 1140 03/16/23 1203  GLUCAP 138* 140* 84 44* 81   D-Dimer: No results for input(s): "DDIMER" in the last 72 hours. Hgb A1c: Recent Labs    03/13/23 2100  HGBA1C 7.5*   Lipid Profile: Recent Labs    03/13/23 2100  CHOL 136  HDL <10*  LDLCALC UNABLE TO CALCULATE IF TRIGLYCERIDE OVER 400 mg/dL  TRIG 161*  LDLDIRECT 40   Thyroid function studies: No results for input(s): "TSH", "T4TOTAL", "T3FREE", "THYROIDAB" in the last 72 hours.  Invalid input(s): "FREET3" Anemia work up: No results for input(s): "VITAMINB12", "FOLATE", "FERRITIN", "TIBC", "IRON", "RETICCTPCT" in the last 72 hours. Sepsis Labs: Recent Labs  Lab 03/13/23 2100 03/13/23 2324 03/14/23 0616 03/14/23 2015 03/16/23 0558  PROCALCITON 30.16  --   --   --   --   WBC 19.9*  --  16.3*  --  13.3*  LATICACIDVEN 3.5* 2.4*  --   0.8  --     Microbiology Recent Results (from the past 240 hours)  Culture, blood (routine x 2)     Status: Abnormal   Collection Time: 03/13/23  9:00 PM   Specimen: BLOOD RIGHT ARM  Result Value Ref Range Status   Specimen Description   Final    BLOOD RIGHT ARM Performed at [facility], [address] [place], [place] [address]    Special Requests   Final    BOTTLES DRAWN AEROBIC AND ANAEROBIC Blood Culture results may not be optimal due to an inadequate volume of blood received in culture bottles Performed at [facility], [address] [place], [place] [address]    Culture  Setup Time   Final    GRAM NEGATIVE RODS IN BOTH AEROBIC AND ANAEROBIC BOTTLES Organism ID to follow CRITICAL RESULT CALLED TO, READ BACK BY AND VERIFIED WITH: Mila Merry PHARMD [id number] 03/14/23 HNM GRAM STAIN REVIEWED-AGREE WITH RESULT DRT Performed at [facility], [address], [place], [place] [address]    Culture PROTEUS MIRABILIS (A)  Final   Report Status 03/16/2023 FINAL  Final   Organism ID, Bacteria PROTEUS MIRABILIS  Final   Organism ID, Bacteria PROTEUS MIRABILIS  Final      Susceptibility   Proteus mirabilis - KIRBY BAUER*    CEFAZOLIN SENSITIVE Sensitive    Proteus  mirabilis - MIC*    AMPICILLIN <=2 SENSITIVE Sensitive     CEFEPIME <=0.12 SENSITIVE Sensitive     CEFTAZIDIME <=1 SENSITIVE Sensitive     CEFTRIAXONE <=0.25 SENSITIVE Sensitive     CIPROFLOXACIN <=0.25 SENSITIVE Sensitive     GENTAMICIN <=1 SENSITIVE Sensitive     IMIPENEM 1 SENSITIVE Sensitive     TRIMETH/SULFA <=20 SENSITIVE Sensitive     AMPICILLIN/SULBACTAM <=2 SENSITIVE Sensitive     PIP/TAZO <=4 SENSITIVE Sensitive ug/mL    * PROTEUS MIRABILIS    PROTEUS MIRABILIS  Blood Culture ID Panel (Reflexed)     Status: Abnormal   Collection Time: 03/13/23  9:00 PM  Result Value Ref Range Status   Enterococcus faecalis NOT DETECTED NOT DETECTED Final   Enterococcus Faecium NOT DETECTED NOT  DETECTED Final   Listeria monocytogenes NOT DETECTED NOT DETECTED Final   Staphylococcus species NOT DETECTED NOT DETECTED Final   Staphylococcus aureus (BCID) NOT DETECTED NOT DETECTED Final   Staphylococcus epidermidis NOT DETECTED NOT DETECTED Final   Staphylococcus lugdunensis NOT DETECTED NOT DETECTED Final   Streptococcus species NOT DETECTED NOT DETECTED Final   Streptococcus agalactiae NOT DETECTED NOT DETECTED Final   Streptococcus pneumoniae NOT DETECTED NOT DETECTED Final   Streptococcus pyogenes NOT DETECTED NOT DETECTED Final   A.calcoaceticus-baumannii NOT DETECTED NOT DETECTED Final   Bacteroides fragilis NOT DETECTED NOT DETECTED Final   Enterobacterales DETECTED (A) NOT DETECTED Final    Comment: Enterobacterales represent a large order of gram negative bacteria, not a single organism. CRITICAL RESULT CALLED TO, READ BACK BY AND VERIFIED WITH: Mila Merry PHARMD 1008 03/14/23 HNM    Enterobacter cloacae complex NOT DETECTED NOT DETECTED Final   Escherichia coli NOT DETECTED NOT DETECTED Final   Klebsiella aerogenes NOT DETECTED NOT DETECTED Final   Klebsiella oxytoca NOT DETECTED NOT DETECTED Final   Klebsiella pneumoniae NOT DETECTED NOT DETECTED Final   Proteus species DETECTED (A) NOT DETECTED Final    Comment: CRITICAL RESULT CALLED TO, READ BACK BY AND VERIFIED WITH: Mila Merry PHARMD 1008 03/14/23 HNM    Salmonella species NOT DETECTED NOT DETECTED Final   Serratia marcescens NOT DETECTED NOT DETECTED Final   Haemophilus influenzae NOT DETECTED NOT DETECTED Final   Neisseria meningitidis NOT DETECTED NOT DETECTED Final   Pseudomonas aeruginosa NOT DETECTED NOT DETECTED Final   Stenotrophomonas maltophilia NOT DETECTED NOT DETECTED Final   Candida albicans NOT DETECTED NOT DETECTED Final   Candida auris NOT DETECTED NOT DETECTED Final   Candida glabrata NOT DETECTED NOT DETECTED Final   Candida krusei NOT DETECTED NOT DETECTED Final   Candida parapsilosis  NOT DETECTED NOT DETECTED Final   Candida tropicalis NOT DETECTED NOT DETECTED Final   Cryptococcus neoformans/gattii NOT DETECTED NOT DETECTED Final   CTX-M ESBL NOT DETECTED NOT DETECTED Final   Carbapenem resistance IMP NOT DETECTED NOT DETECTED Final   Carbapenem resistance KPC NOT DETECTED NOT DETECTED Final   Carbapenem resistance NDM NOT DETECTED NOT DETECTED Final   Carbapenem resist OXA 48 LIKE NOT DETECTED NOT DETECTED Final   Carbapenem resistance VIM NOT DETECTED NOT DETECTED Final    Comment: Performed at [facility], [address] [place], [place] [address]  Culture, blood (routine x 2)     Status: Abnormal   Collection Time: 03/13/23  9:02 PM   Specimen: BLOOD RIGHT ARM  Result Value Ref Range Status   Specimen Description   Final    BLOOD RIGHT ARM Performed at [facility]  [facility], [address], [place], [place] [address]    Special Requests   Final    BOTTLES DRAWN AEROBIC AND ANAEROBIC Blood Culture results may not be optimal due to an inadequate volume of blood received in culture bottles Performed at [facility], [address], [place], [place] [address]    Culture  Setup Time   Final    GRAM NEGATIVE RODS IN BOTH AEROBIC AND ANAEROBIC BOTTLES CRITICAL VALUE NOTED.  VALUE IS CONSISTENT WITH PREVIOUSLY REPORTED AND CALLED VALUE. Performed at [facility], [address] [place], [place] [address]    Culture (A)  Final    PROTEUS MIRABILIS SUSCEPTIBILITIES PERFORMED ON PREVIOUS CULTURE WITHIN THE LAST 5 DAYS. Performed at [facility], [address] [place], [place] [address]    Report Status 03/16/2023 FINAL  Final  Urine Culture     Status: Abnormal   Collection Time: 03/13/23 11:24 PM   Specimen: Urine, Random  Result Value Ref Range Status   Specimen Description   Final    URINE, RANDOM Performed at [facility], [address], [place], [place] [address]    Special Requests   Final     NONE Performed at [facility], [address] [place], [place] [address]    Culture >=100,000 COLONIES/mL PROTEUS MIRABILIS (A)  Final   Report Status 03/16/2023 FINAL  Final   Organism ID, Bacteria PROTEUS MIRABILIS (A)  Final      Susceptibility   Proteus mirabilis - MIC*    AMPICILLIN <=2 SENSITIVE Sensitive     CEFAZOLIN <=4 SENSITIVE Sensitive     CEFEPIME <=0.12 SENSITIVE Sensitive     CEFTRIAXONE <=0.25 SENSITIVE Sensitive     CIPROFLOXACIN <=0.25 SENSITIVE Sensitive     GENTAMICIN <=1 SENSITIVE Sensitive     IMIPENEM 2 SENSITIVE Sensitive     NITROFURANTOIN 128 RESISTANT Resistant     TRIMETH/SULFA 160 RESISTANT Resistant     AMPICILLIN/SULBACTAM <=2 SENSITIVE Sensitive     PIP/TAZO <=4 SENSITIVE Sensitive ug/mL    * >=100,000 COLONIES/mL PROTEUS MIRABILIS    Procedures and diagnostic studies:  DG OR UROLOGY CYSTO IMAGE ([facility] ONLY) Result Date: 03/14/2023 There is no interpretation for this exam.  This order is for images obtained during a surgical procedure.  Please See "Surgeries" Tab for more information regarding the procedure.               LOS: 3 days   Keziyah Kneale  [facility]   Pager on [email or web address]. If 7PM-7AM, please contact night-coverage at [email or web address]     03/16/2023, 1:04 PM

## 2023-03-16 NOTE — Progress Notes (Signed)
Surgical Physician Order Form [facility] [place]  Dr. Apolinar Junes * Scheduling expectation : 2-3 weeks  *Length of Case:   *Clearance needed: no  *Anticoagulation Instructions: N/A  *Aspirin Instructions: Ok to continue Aspirin  *Post-op visit Date/Instructions:  TBD  *Diagnosis: Left Ureteral Stone  *Procedure: left  Ureteroscopy w/laser lithotripsy & stent exchange (13086)   Additional orders: N/A  -Admit type: OUTpatient  -Anesthesia: General  -VTE Prophylaxis Standing Order SCD's       Other:   -Standing Lab Orders Per Anesthesia    Lab other: None  -Standing Test orders EKG/Chest x-ray per Anesthesia       Test other:   - Medications:  Ancef 2gm IV  -Other orders:  N/A

## 2023-03-17 DIAGNOSIS — A419 Sepsis, unspecified organism: Secondary | ICD-10-CM | POA: Diagnosis not present

## 2023-03-17 DIAGNOSIS — R6520 Severe sepsis without septic shock: Secondary | ICD-10-CM | POA: Diagnosis not present

## 2023-03-17 LAB — CBC WITH DIFFERENTIAL/PLATELET
Abs Immature Granulocytes: 0.63 10*3/uL — ABNORMAL HIGH (ref 0.00–0.07)
Basophils Absolute: 0 10*3/uL (ref 0.0–0.1)
Basophils Relative: 0 %
Eosinophils Absolute: 0 10*3/uL (ref 0.0–0.5)
Eosinophils Relative: 0 %
HCT: 31.3 % — ABNORMAL LOW (ref 36.0–46.0)
Hemoglobin: 10.3 g/dL — ABNORMAL LOW (ref 12.0–15.0)
Immature Granulocytes: 4 %
Lymphocytes Relative: 12 %
Lymphs Abs: 1.9 10*3/uL (ref 0.7–4.0)
MCH: 29.3 pg (ref 26.0–34.0)
MCHC: 32.9 g/dL (ref 30.0–36.0)
MCV: 88.9 fL (ref 80.0–100.0)
Monocytes Absolute: 0.8 10*3/uL (ref 0.1–1.0)
Monocytes Relative: 5 %
Neutro Abs: 11.9 10*3/uL — ABNORMAL HIGH (ref 1.7–7.7)
Neutrophils Relative %: 79 %
Platelets: 177 10*3/uL (ref 150–400)
RBC: 3.52 MIL/uL — ABNORMAL LOW (ref 3.87–5.11)
RDW: 14 % (ref 11.5–15.5)
Smear Review: NORMAL
WBC: 15.3 10*3/uL — ABNORMAL HIGH (ref 4.0–10.5)
nRBC: 0 % (ref 0.0–0.2)

## 2023-03-17 LAB — BASIC METABOLIC PANEL
Anion gap: 10 (ref 5–15)
BUN: 39 mg/dL — ABNORMAL HIGH (ref 8–23)
CO2: 22 mmol/L (ref 22–32)
Calcium: 8.4 mg/dL — ABNORMAL LOW (ref 8.9–10.3)
Chloride: 103 mmol/L (ref 98–111)
Creatinine, Ser: 1.16 mg/dL — ABNORMAL HIGH (ref 0.44–1.00)
GFR, Estimated: 49 mL/min — ABNORMAL LOW (ref >60)
Glucose, Bld: 141 mg/dL — ABNORMAL HIGH (ref 70–99)
Potassium: 3.4 mmol/L — ABNORMAL LOW (ref 3.5–5.1)
Sodium: 135 mmol/L (ref 135–145)

## 2023-03-17 LAB — GLUCOSE, CAPILLARY
Glucose-Capillary: 130 mg/dL — ABNORMAL HIGH (ref 70–99)
Glucose-Capillary: 241 mg/dL — ABNORMAL HIGH (ref 70–99)

## 2023-03-17 MED ORDER — POTASSIUM CHLORIDE CRYS ER 20 MEQ PO TBCR
40.0000 meq | EXTENDED_RELEASE_TABLET | Freq: Once | ORAL | Status: AC
  Administered 2023-03-17: 40 meq via ORAL
  Filled 2023-03-17: qty 2

## 2023-03-17 MED ORDER — ORAL CARE MOUTH RINSE: 15.0000 mL | OROMUCOSAL | Status: DC | PRN

## 2023-03-17 MED ORDER — AMOXICILLIN 500 MG PO CAPS: 1000.0000 mg | ORAL_CAPSULE | Freq: Three times a day (TID) | ORAL | 0 refills | Status: AC

## 2023-03-17 MED ORDER — ACETAMINOPHEN 325 MG PO TABS: 650.0000 mg | ORAL_TABLET | Freq: Four times a day (QID) | ORAL | Status: DC | PRN

## 2023-03-17 NOTE — Progress Notes (Signed)
Physical Therapy Treatment Patient Details Name: Kelsey Wise MRN: [id number] DOB: [birth year] Today's Date: 03/17/2023   History of Present Illness [name] who [facility] with acute onset weakness, difficulty moving, admitted with sepsis 2/2 UTI.    PT Comments  Pt presents supine in bed and alert and oriented and agreeable to PT. Unable to access interpreter on ipad, so ended up speaking through son. Pt shows improved mobility compared to last session with ability to perform sit to stand and ambulate with decreased assistance. Nursing says patient is discharging today and son says that assistance will be available at home to assist patient.     If plan is discharge home, recommend the following: Assistance with cooking/housework;Assistance with feeding;Direct supervision/assist for medications management;Assist for transportation   Can travel by private vehicle        Equipment Recommendations  Rolling walker (2 wheels)    Recommendations for Other Services       Precautions / Restrictions Precautions Precautions: Fall Restrictions Weight Bearing Restrictions Per Provider Order: No     Mobility  Bed Mobility Overal bed mobility: Needs Assistance Bed Mobility: Supine to Sit Rolling: Mod assist   Supine to sit: Mod assist          Transfers Overall transfer level: Modified independent Equipment used: Rolling walker (2 wheels) Transfers: Sit to/from Stand Sit to Stand: Modified independent (Device/Increase time)                Ambulation/Gait Ambulation/Gait assistance: Modified independent (Device/Increase time) Gait Distance (Feet): 10 Feet Assistive device: Rolling walker (2 wheels) Gait Pattern/deviations: Shuffle Gait velocity: Slow         Stairs             Wheelchair Mobility     Tilt Bed    Modified Rankin (Stroke Patients Only)       Balance Overall balance assessment: Modified Independent                                           Cognition Arousal: Alert Behavior During Therapy: WFL for tasks assessed/performed Overall Cognitive Status: Difficult to assess                                 General Comments: Unable to interpret patient in real time        Exercises      General Comments        Pertinent Vitals/Pain Pain Assessment Pain Assessment: No/denies pain    Home Living                          Prior Function            PT Goals (current goals can now be found in the care plan section) Acute Rehab PT Goals PT Goal Formulation: Patient unable to participate in goal setting Time For Goal Achievement: 03/31/23 Potential to Achieve Goals: Good Progress towards PT goals: Progressing toward goals    Frequency    Min 1X/week      PT Plan      Co-evaluation              AM-PAC PT "6 Clicks" Mobility   Outcome Measure  Help needed turning from your back to your side while in a flat  bed without using bedrails?: A Little Help needed moving from lying on your back to sitting on the side of a flat bed without using bedrails?: A Little Help needed moving to and from a bed to a chair (including a wheelchair)?: A Little Help needed standing up from a chair using your arms (e.g., wheelchair or bedside chair)?: A Little Help needed to walk in hospital room?: A Little Help needed climbing 3-5 steps with a railing? : A Lot 6 Click Score: 17    End of Session Equipment Utilized During Treatment: Gait belt Activity Tolerance: Patient tolerated treatment well Patient left: in bed;with bed alarm set   PT Visit Diagnosis: Muscle weakness (generalized) (M62.81)     Time: 1610-9604 PT Time Calculation (min) (ACUTE ONLY): 40 min  Charges:    $Therapeutic Activity: 38-52 mins PT General Charges $$ ACUTE PT VISIT: 1 Visit                     Ellin Goodie PT, DPT

## 2023-03-17 NOTE — Care Management Important Message (Signed)
Important Message  Patient Details  Name: Kelsey Wise MRN: [id number] Date of Birth: 1946-03-24   Important Message Given:  Yes - Medicare IM     Bernadette Hoit 03/17/2023, 9:50 AM

## 2023-03-17 NOTE — Discharge Summary (Signed)
Physician Discharge Summary   Patient: Kelsey Wise MRN: [id number] DOB: [birth year]  Admit date:     03/13/2023  Discharge date: 03/17/23  Discharge Physician: Lurene Shadow   PCP: Hillery Aldo, MD   Recommendations at discharge:   Follow-up with Dr. Vanna Scotland, urologist, as an outpatient (a physical call to schedule appointment) Follow-up with PCP in 1 week  Discharge Diagnoses: Principal Problem:   Severe sepsis (HCC) Active Problems:   UTI (urinary tract infection)   Myocardial injury   Abnormal LFTs   AKI (acute kidney injury) (HCC)   HLD (hyperlipidemia)   Hypothyroidism   Hypokalemia   Diabetes mellitus with hyperglycemia (HCC)   HTN (hypertension)   Bacteremia due to Proteus species  Resolved Problems:   * No resolved hospital problems. *  Hospital Course:  Kelsey Wise is a 77 y.o. female with medical history significant of HLD, pre-DM, hypothyroidism, carpal tunnel syndrome, back pain, arthritis,, who presented to the hospital with general weakness, dysuria and lower abdominal pain for 4 days duration.   She was found to have severe sepsis secondary to acute complicated UTI and left ureteral calculus.  WBC was 19.9 lactic acid was 3.5, creatinine 1.94, potassium 3.3, troponin 127 on admission.   Assessment and Plan:   Severe sepsis secondary to acute complicated Proteus UTI and Proteus bacteremia: Blood and urine cultures showed Proteus species.  Previously treated with IV ceftriaxone.  She will be discharged on amoxicillin for 10 days to complete 14 days of antibiotics.     Obstructing left ureteral calculus, left hydroureteronephrosis: S/p left ureteral stent on 03/14/2023.  Follow-up with Dr. Apolinar Junes, urologist, as an outpatient in 3 weeks for left uteroscopy with laser lithotripsy and stent exchange..     AKI: Improved baseline creatinine is unknown.  Outpatient follow-up with PCP for monitoring recommended.     Hypokalemia: Improved     Low  back pain: X-ray lumbar spine showed mild degenerative changes at L3/L4 and L4-L5.  Analgesics as needed for pain.  Follow-up with PCP.      Elevated troponins: Peak troponin was 154.  This was attributed to demand ischemia from severe sepsis.     Type 2 diabetes mellitus, hypoglycemia: Resume Jardiance and metformin at discharge Hemoglobin A1c 7.5.     Thrombocytopenia: Resolved     Elevated liver enzymes: Improved Hyponatremia: Improved   General weakness: PT recommended home health therapy.     Other medical problems include hypothyroidism, hyperlipidemia, hypertension, arthritis    General Weakness: PT recommended home health therapy but patient declined home health  Her condition has improved and she is deemed stable for discharge to home today. Plan discussed with Kelsey Fus, son, over the phone.  He is agreeable with the discharge plan.   [place] interpretation service via iPad was used for this encounter.         Consultants: Urologist Procedures performed: Left ureteral stent placement on 03/14/2023 Disposition: Home Diet recommendation:  Cardiac and Carb modified diet DISCHARGE MEDICATION: Allergies as of 03/17/2023   No Known Allergies      Medication List     STOP taking these medications    meloxicam 15 MG tablet Commonly known as: MOBIC       TAKE these medications    acetaminophen 325 MG tablet Commonly known as: TYLENOL Take 2 tablets (650 mg total) by mouth every 6 (six) hours as needed. What changed:  how much to take when to take this reasons to take this   amoxicillin 500  MG capsule Commonly known as: AMOXIL Take 2 capsules (1,000 mg total) by mouth 3 (three) times daily for 10 days.   aspirin 81 MG chewable tablet Chew 1 tablet by mouth daily.   atorvastatin 80 MG tablet Commonly known as: LIPITOR Take 1 tablet by mouth daily.   buPROPion 150 MG 12 hr tablet Commonly known as: WELLBUTRIN SR Take 1 tablet by mouth  daily.   DULoxetine 60 MG capsule Commonly known as: CYMBALTA Take 60 mg by mouth daily.   gabapentin 600 MG tablet Commonly known as: NEURONTIN Take 1 tablet by mouth 2 (two) times daily.   Garlic 1000 MG Caps Take 1 capsule by mouth daily.   Jardiance 10 MG Tabs tablet Generic drug: empagliflozin Take by mouth daily.   levothyroxine 50 MCG tablet Commonly known as: SYNTHROID Take 1 tablet by mouth daily.   loratadine 10 MG tablet Commonly known as: CLARITIN Take 1 tablet by mouth daily.   metFORMIN 500 MG 24 hr tablet Commonly known as: GLUCOPHAGE-XR Take 500 mg by mouth daily with breakfast.   multivitamin with minerals tablet Take 1 tablet by mouth daily.   olopatadine 0.1 % ophthalmic solution Commonly known as: PATANOL SMARTSIG:In Eye(s)   triamterene-hydrochlorothiazide 37.5-25 MG tablet Commonly known as: MAXZIDE-25 Take 1 tablet by mouth daily.               Durable Medical Equipment  (From admission, onward)           Start     Ordered   03/17/23 1338  For home use only DME Walker rolling  Once       Question Answer Comment  Walker: With 5 Inch Wheels   Patient needs a walker to treat with the following condition Weakness      03/17/23 1338            Follow-up Information     Hillery Aldo, MD. Go on 04/04/2023.   Specialty: Family Medicine Why: Dr. Allena Katz, Monday, 1/6 at 9:20 a.m. Contact information: [address] [place] [address] [phone]                Discharge Exam: Ceasar Mons Weights   03/14/23 1618  Weight: 92 kg   GEN: NAD SKIN: Warm and dry EYES: No pallor or icterus ENT: MMM CV: RRR PULM: CTA B ABD: soft, obese, NT, +BS CNS: AAO x 3, non focal EXT: No edema or tenderness   Condition at discharge: good  The results of significant diagnostics from this hospitalization (including imaging, microbiology, ancillary and laboratory) are listed below for reference.   Imaging  Studies: DG Lumbar Spine 2-3 Views Result Date: 03/16/2023 CLINICAL DATA:  Low back pain EXAM: LUMBAR SPINE - 2-3 VIEW COMPARISON:  CT abdomen and pelvis 03/14/2023 FINDINGS: There is a new left ureteral stent in place. Proximal left ureteral calculus is again seen measuring 4 mm. Alignment is anatomic. There is no evidence for acute fracture. There are mild degenerative changes at L3-L4 and mild degenerative changes at L4-L5 with disc space narrowing and endplate osteophyte formation which appears similar to prior. IMPRESSION: 1. New left ureteral stent in place. Unchanged proximal left ureteral calculus. 2. Mild degenerative changes at L3-L4 and L4-L5. Electronically Signed   By: Darliss Cheney M.D.   On: 03/16/2023 19:13   DG OR UROLOGY CYSTO IMAGE ([facility] ONLY) Result Date: 03/14/2023 There is no interpretation for this exam.  This order is for images obtained during a surgical procedure.  Please See "Surgeries" Tab for more information regarding the procedure.   CT ABDOMEN PELVIS WO CONTRAST Result Date: 03/14/2023 CLINICAL DATA:  Sepsis EXAM: CT ABDOMEN AND PELVIS WITHOUT CONTRAST TECHNIQUE: Multidetector CT imaging of the abdomen and pelvis was performed following the standard protocol without IV contrast. RADIATION DOSE REDUCTION: This exam was performed according to the departmental dose-optimization program which includes automated exposure control, adjustment of the mA and/or kV according to patient size and/or use of iterative reconstruction technique. COMPARISON:  None Available. FINDINGS: Lower chest: Streaky bibasilar atelectasis and probable scarring changes. Very small bilateral pleural effusions. No pericardial effusion. Hepatobiliary: No worrisome hepatic lesions are identified without contrast. No intrahepatic biliary dilatation. Small calcified granuloma noted in the right hepatic. Gallbladder is mildly distended with maximum transverse diameter 4.8 cm. No calcified gallstones or  pericholecystic inflammatory changes or fluid. Normal caliber common bile duct. Pancreas: Unremarkable. No pancreatic ductal dilatation or surrounding inflammatory changes. Spleen: Normal in size without focal abnormality. Adrenals/Urinary Tract: Adrenal glands are normal. No renal calculi or renal mass. There is significant left-sided hydronephrosis and proximal left hydroureter down to an obstructing 6 x 5 mm calculus in the upper left ureter noted at the L3-4 disc space level. No right-sided ureteral calculi. No bladder calculi. No obvious bladder mass. Stomach/Bowel: The stomach, duodenum, small bowel and colon are grossly normal without oral contrast. No inflammatory changes, mass lesions or obstructive findings. Sigmoid colon diverticulosis. Vascular/Lymphatic: The aorta is normal in caliber. Scattered atheroscerlotic calcifications. No mesenteric of retroperitoneal mass or adenopathy. Small scattered lymph nodes are noted. Reproductive: The uterus and ovaries are unremarkable. Other: No pelvic mass or adenopathy. No free pelvic fluid collections. No inguinal mass or adenopathy. No abdominal wall hernia or subcutaneous lesions. Musculoskeletal: No significant bony findings. IMPRESSION: 1. 6 x 5 mm obstructing upper left ureteral calculus with associated hydroureteronephrosis. 2. No other urinary tract calculi. 3. Sigmoid colon diverticulosis. 4. Mildly distended gallbladder but no calcified gallstones or pericholecystic inflammatory changes or fluid. 5. Very small bilateral pleural effusions and bibasilar atelectasis. Electronically Signed   By: Rudie Meyer M.D.   On: 03/14/2023 11:41   DG Chest Portable 1 View Result Date: 03/13/2023 CLINICAL DATA:  Weakness and hyperglycemia. EXAM: PORTABLE CHEST 1 VIEW COMPARISON:  None Available. FINDINGS: The lungs are expiratory, limiting visualization of the lower lung fields. There are atelectatic bands in both bases. The visualized lungs are otherwise clear.  There is mild cardiomegaly without evidence CHF. The aorta is tortuous and mildly ectatic with scattered calcific plaques. The mediastinum otherwise unremarkable. There are osteophytes of the spine and both AC joints. No acute skeletal findings. IMPRESSION: 1. Expiratory chest with atelectatic bands in both bases. 2. No evidence of acute chest disease, but with limited view of the lower lung zones. 3. Mild cardiomegaly without evidence of CHF. 4. Aortic atherosclerosis. Electronically Signed   By: Almira Bar M.D.   On: 03/13/2023 21:28    Microbiology: Results for orders placed or performed during the hospital encounter of 03/13/23  Culture, blood (routine x 2)     Status: Abnormal   Collection Time: 03/13/23  9:00 PM   Specimen: BLOOD RIGHT ARM  Result Value Ref Range Status   Specimen Description   Final    BLOOD RIGHT ARM Performed at [facility], [address], [place], [place] [address]    Special Requests   Final    BOTTLES DRAWN AEROBIC AND ANAEROBIC Blood Culture results may not be optimal due to  an inadequate volume of blood received in culture bottles Performed at [facility], [address] [place], [place] [address]    Culture  Setup Time   Final    GRAM NEGATIVE RODS IN BOTH AEROBIC AND ANAEROBIC BOTTLES Organism ID to follow CRITICAL RESULT CALLED TO, READ BACK BY AND VERIFIED WITH: Mila Merry PHARMD [id number] 03/14/23 HNM GRAM STAIN REVIEWED-AGREE WITH RESULT DRT Performed at [facility], [address] [place], [place] [address]    Culture PROTEUS MIRABILIS (A)  Final   Report Status 03/16/2023 FINAL  Final   Organism ID, Bacteria PROTEUS MIRABILIS  Final   Organism ID, Bacteria PROTEUS MIRABILIS  Final      Susceptibility   Proteus mirabilis - KIRBY BAUER*    CEFAZOLIN SENSITIVE Sensitive    Proteus mirabilis - MIC*    AMPICILLIN <=2 SENSITIVE Sensitive     CEFEPIME <=0.12 SENSITIVE Sensitive     CEFTAZIDIME <=1 SENSITIVE Sensitive      CEFTRIAXONE <=0.25 SENSITIVE Sensitive     CIPROFLOXACIN <=0.25 SENSITIVE Sensitive     GENTAMICIN <=1 SENSITIVE Sensitive     IMIPENEM 1 SENSITIVE Sensitive     TRIMETH/SULFA <=20 SENSITIVE Sensitive     AMPICILLIN/SULBACTAM <=2 SENSITIVE Sensitive     PIP/TAZO <=4 SENSITIVE Sensitive ug/mL    * PROTEUS MIRABILIS    PROTEUS MIRABILIS  Blood Culture ID Panel (Reflexed)     Status: Abnormal   Collection Time: 03/13/23  9:00 PM  Result Value Ref Range Status   Enterococcus faecalis NOT DETECTED NOT DETECTED Final   Enterococcus Faecium NOT DETECTED NOT DETECTED Final   Listeria monocytogenes NOT DETECTED NOT DETECTED Final   Staphylococcus species NOT DETECTED NOT DETECTED Final   Staphylococcus aureus (BCID) NOT DETECTED NOT DETECTED Final   Staphylococcus epidermidis NOT DETECTED NOT DETECTED Final   Staphylococcus lugdunensis NOT DETECTED NOT DETECTED Final   Streptococcus species NOT DETECTED NOT DETECTED Final   Streptococcus agalactiae NOT DETECTED NOT DETECTED Final   Streptococcus pneumoniae NOT DETECTED NOT DETECTED Final   Streptococcus pyogenes NOT DETECTED NOT DETECTED Final   A.calcoaceticus-baumannii NOT DETECTED NOT DETECTED Final   Bacteroides fragilis NOT DETECTED NOT DETECTED Final   Enterobacterales DETECTED (A) NOT DETECTED Final    Comment: Enterobacterales represent a large order of gram negative bacteria, not a single organism. CRITICAL RESULT CALLED TO, READ BACK BY AND VERIFIED WITH: Mila Merry PHARMD 1008 03/14/23 HNM    Enterobacter cloacae complex NOT DETECTED NOT DETECTED Final   Escherichia coli NOT DETECTED NOT DETECTED Final   Klebsiella aerogenes NOT DETECTED NOT DETECTED Final   Klebsiella oxytoca NOT DETECTED NOT DETECTED Final   Klebsiella pneumoniae NOT DETECTED NOT DETECTED Final   Proteus species DETECTED (A) NOT DETECTED Final    Comment: CRITICAL RESULT CALLED TO, READ BACK BY AND VERIFIED WITH: Mila Merry PHARMD 1008 03/14/23 HNM     Salmonella species NOT DETECTED NOT DETECTED Final   Serratia marcescens NOT DETECTED NOT DETECTED Final   Haemophilus influenzae NOT DETECTED NOT DETECTED Final   Neisseria meningitidis NOT DETECTED NOT DETECTED Final   Pseudomonas aeruginosa NOT DETECTED NOT DETECTED Final   Stenotrophomonas maltophilia NOT DETECTED NOT DETECTED Final   Candida albicans NOT DETECTED NOT DETECTED Final   Candida auris NOT DETECTED NOT DETECTED Final   Candida glabrata NOT DETECTED NOT DETECTED Final   Candida krusei NOT DETECTED NOT DETECTED Final   Candida parapsilosis NOT DETECTED NOT DETECTED Final   Candida tropicalis  NOT DETECTED NOT DETECTED Final   Cryptococcus neoformans/gattii NOT DETECTED NOT DETECTED Final   CTX-M ESBL NOT DETECTED NOT DETECTED Final   Carbapenem resistance IMP NOT DETECTED NOT DETECTED Final   Carbapenem resistance KPC NOT DETECTED NOT DETECTED Final   Carbapenem resistance NDM NOT DETECTED NOT DETECTED Final   Carbapenem resist OXA 48 LIKE NOT DETECTED NOT DETECTED Final   Carbapenem resistance VIM NOT DETECTED NOT DETECTED Final    Comment: Performed at [facility], [address] [place], [place] [address]  Culture, blood (routine x 2)     Status: Abnormal   Collection Time: 03/13/23  9:02 PM   Specimen: BLOOD RIGHT ARM  Result Value Ref Range Status   Specimen Description   Final    BLOOD RIGHT ARM Performed at [facility], [address] [place], [place] [address]    Special Requests   Final    BOTTLES DRAWN AEROBIC AND ANAEROBIC Blood Culture results may not be optimal due to an inadequate volume of blood received in culture bottles Performed at [facility], [address] [place], [place] [address]    Culture  Setup Time   Final    GRAM NEGATIVE RODS IN BOTH AEROBIC AND ANAEROBIC BOTTLES CRITICAL VALUE NOTED.  VALUE IS CONSISTENT WITH PREVIOUSLY REPORTED AND CALLED VALUE. Performed at [facility], [address] [place], [place] [address]    Culture (A)  Final    PROTEUS MIRABILIS SUSCEPTIBILITIES PERFORMED ON PREVIOUS CULTURE WITHIN THE LAST 5 DAYS. Performed at [facility], [address], [place], [place] [address]    Report Status 03/16/2023 FINAL  Final  Urine Culture     Status: Abnormal   Collection Time: 03/13/23 11:24 PM   Specimen: Urine, Random  Result Value Ref Range Status   Specimen Description   Final    URINE, RANDOM Performed at [facility], [address] [place], [place] [address]    Special Requests   Final    NONE Performed at [facility], [address] [place], [place] [address]    Culture >=100,000 COLONIES/mL PROTEUS MIRABILIS (A)  Final   Report Status 03/16/2023 FINAL  Final   Organism ID, Bacteria PROTEUS MIRABILIS (A)  Final      Susceptibility   Proteus mirabilis - MIC*    AMPICILLIN <=2 SENSITIVE Sensitive     CEFAZOLIN <=4 SENSITIVE Sensitive     CEFEPIME <=0.12 SENSITIVE Sensitive     CEFTRIAXONE <=0.25 SENSITIVE Sensitive     CIPROFLOXACIN <=0.25 SENSITIVE Sensitive     GENTAMICIN <=1 SENSITIVE Sensitive     IMIPENEM 2 SENSITIVE Sensitive     NITROFURANTOIN 128 RESISTANT Resistant     TRIMETH/SULFA 160 RESISTANT Resistant     AMPICILLIN/SULBACTAM <=2 SENSITIVE Sensitive     PIP/TAZO <=4 SENSITIVE Sensitive ug/mL    * >=100,000 COLONIES/mL PROTEUS MIRABILIS    Labs: CBC: Recent Labs  Lab 03/13/23 2100 03/14/23 0616 03/16/23 0558 03/17/23 0515  WBC 19.9* 16.3* 13.3* 15.3*  NEUTROABS  --   --  10.4* 11.9*  HGB 13.3 10.5* 11.0* 10.3*  HCT 39.9 31.3* 33.9* 31.3*  MCV 89.5 89.2 90.6 88.9  PLT 141* 112* 137* 177   Basic Metabolic Panel: Recent Labs  Lab 03/13/23 2100 03/13/23 2324 03/14/23 0616 03/14/23 1001 03/16/23 0558 03/17/23 0515  NA 130*  --  136  --  137 135  K 3.3*  --  3.4*  --  4.0 3.4*  CL 92*  --  102  --  108 103  CO2 23  --  24  --  20* 22  GLUCOSE 370*  --  192*  --  105* 141*   BUN 65*  --  54*  --  50* 39*  CREATININE 1.94*  --  1.54*  --  1.10* 1.16*  CALCIUM 8.4*  --  7.7*  --  8.3* 8.4*  MG 2.1  --   --  2.1  --   --   PHOS  --  3.2  --   --   --   --    Liver Function Tests: Recent Labs  Lab 03/13/23 2100 03/14/23 0616  AST 49* 32  ALT 36 27  ALKPHOS 140* 104  BILITOT 1.9* 1.0  PROT 7.6 5.9*  ALBUMIN 3.2* 2.4*   CBG: Recent Labs  Lab 03/16/23 1203 03/16/23 1543 03/16/23 2122 03/17/23 0737 03/17/23 1130  GLUCAP 81 150* 175* 130* 241*    Discharge time spent: greater than 30 minutes.  Signed: Lurene Shadow, MD [facility] 03/17/2023

## 2023-03-17 NOTE — Progress Notes (Signed)
Discharge instructions reviewed with patient and son including followup visits and new medications.  Understanding was verbalized and all questions were answered.  IV removed without complication; patient tolerated well.  Patient awaiting delivery of walker prior to discharge.

## 2023-03-17 NOTE — TOC Initial Note (Signed)
Transition of Care [facility]) - Initial/Assessment Note    Patient Details  Name: Kelsey Wise MRN: [id number] Date of Birth: June 14, 1945  Transition of Care [facility]) CM/SW Contact:    Chapman Fitch, RN Phone Number: 03/17/2023, 1:44 PM  Clinical Narrative:                  Met with patient at bedside utilizing [employer] electronic [place] interpreter      Admitted for: Sepsis Admitted from: home with son and husband PCP: Patient states she does not have a PCP.  List of local PCP added to AVS Current home health/prior home health/DME: [facility]  [facility]rapy recommending home health.  Patient declines. Patient in agreement to RW, declines BSC.   Referral made to Jon with [employer] for RW to be delivered to room   Patient Goals and CMS Choice            Expected Discharge Plan and Services         Expected Discharge Date: 03/17/23                                    Prior Living Arrangements/Services                       Activities of Daily Living   ADL Screening (condition at time of admission) Independently performs ADLs?: Yes (appropriate for developmental age) Is the patient deaf or have difficulty hearing?: No Does the patient have difficulty seeing, even when wearing glasses/contacts?: No Does the patient have difficulty concentrating, remembering, or making decisions?: No  Permission Sought/Granted                  Emotional Assessment              Admission diagnosis:  AKI (acute kidney injury) (HCC) [N17.9] Sepsis (HCC) [A41.9] Sepsis without acute organ dysfunction, due to unspecified organism [facility]) [A41.9] Patient Active Problem List   Diagnosis Date Noted   Bacteremia due to Proteus species 03/15/2023   UTI (urinary tract infection) 03/14/2023   HTN (hypertension) 03/14/2023   Severe sepsis (HCC) 03/13/2023   AKI (acute kidney injury) (HCC) 03/13/2023   Myocardial injury 03/13/2023   Abnormal LFTs 03/13/2023   Hypokalemia  03/13/2023   Diabetes mellitus with hyperglycemia (HCC) 03/13/2023   HLD (hyperlipidemia)    Hypothyroidism    Carpal tunnel syndrome    Venous insufficiency    Low back pain 05/04/2019   Osteoarthritis of knee 05/04/2019   Numbness and tingling of right upper extremity 05/10/2018   Right arm pain 05/10/2018   Right hand weakness 05/10/2018   Limb swelling 03/08/2014   PCP:  Hillery Aldo, MD Pharmacy:   [facility] - [place], [place] - [address] [place] [address] Phone: [phone] Fax: [phone]     Social Drivers of Health (SDOH) Social History: SDOH Screenings   Food Insecurity: No Food Insecurity (03/14/2023)  Housing: Low Risk  (03/14/2023)  Transportation Needs: No Transportation Needs (03/14/2023)  Utilities: Not At Risk (03/14/2023)  Tobacco Use: Low Risk  (03/14/2023)   SDOH Interventions:     Readmission Risk Interventions     No data to display

## 2023-03-18 ENCOUNTER — Telehealth: Payer: Self-pay

## 2023-03-18 NOTE — Telephone Encounter (Signed)
  Per Dr. Apolinar Junes, Patient is to be scheduled for Left Ureteroscopy with Laser Lithotripsy and Stent Exchange   [name] and her Justice [place] were contacted and possible surgical dates were discussed, Monday January 6th, 2025 was agreed upon for surgery. Patient was instructed that   Patient was directed to call [phone] between 1-3pm the day before surgery to find out surgical arrival time.  Instructions were given not to eat or drink from midnight on the night before surgery and have a driver for the day of surgery. On the surgery day patient was instructed to enter through the [facility] entrance of [facility] report the Same Day Surgery desk.   Pre-Admit Testing will be in contact via phone to set up an interview with the anesthesia team to review your history and medications prior to surgery.   Reminder of this information was sent via MyChart to the patient.

## 2023-03-18 NOTE — Progress Notes (Signed)
   [facility] Surgical Posting Form  Surgery Date: Date: 04/04/2023  Surgeon: Dr. Vanna Scotland, MD  Inpt ( No  )   Outpt (Yes)   Obs ( No  )   Diagnosis: N20.1 Left Ureteral Stone  -CPT: [phone]  Surgery: Left Ureteroscopy with Laser Lithotripsy and Stent Exchange  Stop Anticoagulations: No, may continue ASA  Cardiac/Medical/Pulmonary Clearance needed: no  *Orders entered into EPIC  Date: 03/18/23   *Case booked in [place]  Date: 03/18/23  *Notified pt of Surgery: Date: 03/18/23  PRE-OP UA & CX: no  *Placed into Prior Authorization Work [place] Date: 03/18/23  Assistant/laser/rep:No

## 2023-03-29 ENCOUNTER — Other Ambulatory Visit: Payer: Medicare Other

## 2023-03-29 ENCOUNTER — Encounter
Admission: RE | Admit: 2023-03-29 | Discharge: 2023-03-29 | Disposition: A | Discharge status: Home / Self Care | Payer: Medicare Other | Source: Ambulatory Visit | Attending: Urology | Admitting: Urology

## 2023-03-29 ENCOUNTER — Other Ambulatory Visit: Payer: Self-pay

## 2023-03-29 VITALS — Ht 61.81 in | Wt 185.0 lb

## 2023-03-29 DIAGNOSIS — E1165 Type 2 diabetes mellitus with hyperglycemia: Secondary | ICD-10-CM

## 2023-03-29 DIAGNOSIS — E876 Hypokalemia: Secondary | ICD-10-CM

## 2023-03-29 HISTORY — DX: Non-ischemic myocardial injury (non-traumatic): I5A

## 2023-03-29 HISTORY — DX: Unspecified osteoarthritis, unspecified site: M19.90

## 2023-03-29 HISTORY — DX: Type 2 diabetes mellitus without complications: E11.9

## 2023-03-29 HISTORY — DX: Essential (primary) hypertension: I10

## 2023-03-29 NOTE — Patient Instructions (Signed)
 Your procedure is scheduled on: Monday 04/03/22 To find out your arrival time, please call [phone] between 1PM - 3PM on:  Friday 03/31/22  Report to the Registration Desk on the 1st floor of the [facility]. FREE Valet parking is available.  ? ?????????? ??? ???? ??????????????? ???: ??????? 04/03/22 ??? ?? ?????? ??? ??? ?????? ???, ??????? ??? [phone] ?????? 13:00 - 15:00 ??? ????????? 03/31/22  ??????? ??? Registration Desk ???? 1? ????? ??? [facility]. ?????????? ?????? ???????? ??????????. I diadikasa sas chei programmatiste gia: Deftra 04/03/22 Gia na mthete tin ra fixs alroy edris batch [phone] metax 13:00 - 15:00 tin Paraskev 03/31/22  Anafor sto Registration Desk ston 1o rofo [employer]. Diatthetai doren ypiresa parkadrou.  If your arrival time is 6:00 am, do not arrive before that time as the [facility] entrance doors do not open until 6:00 am.  REMEMBER: Instructions that are not followed completely may result in serious medical risk, up to and including death; or upon the discretion of your surgeon and anesthesiologist your surgery may need to be rescheduled.  Do not eat food or drink any liquids after midnight the night before surgery.  No gum chewing or hard candies.  ??? ????? ?????? ? ?????? ???? ???? ?? ????????? ?? ????? ???? ??? ??? ????????.  ????? ?????? ??????? ? ??????? ?????????. Min trte fagit  pnete ygr met ta mesnychta to vrdy prin ap tin epmvasi.  Chors msima tschlas  sklirs karamles.  One week prior to surgery: Stop Anti-inflammatories (NSAIDS) such as Advil, Aleve, Ibuprofen, Motrin, Naproxen, Naprosyn and Aspirin  based products such as Excedrin, Goody's Powder, BC Powder. You may however, continue to take Tylenol  if needed for pain up until the day of surgery.  ??? ???????? ???? ??? ????????: ?????????? ?? ?????????????? (????) ???? ?? Advil, Aleve, Ibuprofen, Motrin, Naproxen, Naprosyn ??? ???????? ??  ???? ??? ???????? ???? Excedrin, Goody's Powder, BC Powder. ???????? ?????? ?? ?????????? ?? ???????? ?? Tylenol  ??? ?????????? ??? ???? ????? ??? ????? ??? ?????????. Ma evdomda prin tin epmvasi: Stamatste ta antiflegmondi (MSAF) pos ta Advil, Aleve, Ibuprofen, Motrin, Naproxen, Naprosyn kai pronta me vsi tin aspirni pos Excedrin, Goody's Powder, BC Powder. Borete ostso na synechsete na parnete to Tylenol  en chreizetai gia pno mchri tin imra tis epmvasis.  Stop ANY OVER THE COUNTER supplements for 7 days until after surgery.  ???????? ??????????? ???????????? ????? ??????? ??????? ??? 7 ?????? ????? ???? ??? ????????. Diakpste OPOIADIPOTE symplirmata chors iatrik syntag gia 7 imres mchri met tin epmvasi.  Continue taking all prescribed medications with the exception of the following: Hold Jardiance for 3 days with last dose Thursday 03/31/23. Hold Metformin for 2 days with last Friday 04/01/23. May restart after your procedure.  ????????? ?? ???????? ??? ?? ????????????????? ??????? ?? ???????? ?? ????????: ???????? ?? Jardiance ??? 3 ?????? ?? ????????? ???? ?????? 03/31/23. ???????? ?? Metformin ??? 2 ?????? ?? ??? ????????? ????????? 04/01/23. ?????? ?? ????????? ???? ???? ?? ?????????? ???. Synechste na parnete la ta syntagografomena frmaka me exaresi ta kennyth: Kratste to [place] gia 3 imres me teleftaa dsi Pmpti 03/31/23. Kratste to Metformin gia 2 imres me tin teleftaa Paraskev 04/01/23. Bore na xekinsei xan met ti ipjipxjdj sas.  TAKE ONLY THESE MEDICATIONS THE MORNING OF SURGERY WITH A SIP OF WATER:  atorvastatin  (LIPITOR) 80 MG tablet  DULoxetine (CYMBALTA) 60 MG capsule  gabapentin  (NEURONTIN ) 600 MG tablet  levothyroxine  (SYNTHROID ) 50 MCG tablet  loratadine (CLARITIN) 10 MG tablet   ?????? ???? ???? ?? ??????? ?? ???? ??? ??????????? ?? ??? ?????? ????:  1. ?????? ?????????????? (LIPITOR) 80 MG  2. ??????? DULoxetine (CYMBALTA) 60 MG   3. ?????? gabapentin  (NEURONTIN ) 600 MG  4. ?????? ????????????? (SYNTHROID ) 50 MCG  5. ?????????? (CLARITIN) ?????? 10 MG PARETE MONO AUTA TA FARMAKA TO PROI TIS CHEIROURGIAS ME MIA GOULIA NERO:  1. disko atorvastatnis (LIPITOR) 80 MG  2. Kpsoula DULoxetine (CYMBALTA) 60 MG  3. disko gabapentin  (NEURONTIN ) 600 MG  4. disko levothyroxnis (SYNTHROID ) 50 MCG  5. loratadni (CLARITIN) disko 10 MG  No Alcohol for 24 hours before or after surgery.  No Smoking including e-cigarettes for 24 hours before surgery.  No chewable tobacco products for at least 6 hours before surgery.  No nicotine patches on the day of surgery.  Do not use any recreational drugs for at least a week (preferably 2 weeks) before your surgery.  Please be advised that the combination of cocaine and anesthesia may have negative outcomes, up to and including death. If you test positive for cocaine, your surgery will be cancelled.  On the morning of surgery brush your teeth with toothpaste and water, you may rinse your mouth with mouthwash if you wish. Do not swallow any toothpaste or mouthwash.  Use CHG Soap or wipes as directed on instruction sheet. Shower with your regular soap the morning of your procedure.  Do not wear lotions, powders, or perfumes.   Do not shave body hair from the neck down 48 hours before surgery.  Wear clean comfortable clothing (specific to your surgery type) to the hospital.  Do not wear jewelry, make-up, hairpins, clips or nail polish.  For welded (permanent) jewelry: bracelets, anklets, waist bands, etc.  Please have this removed prior to surgery.  If it is not removed, there is a chance that hospital personnel will need to cut it off on the day of surgery. Contact lenses, hearing aids and dentures may not be worn into surgery.  Do not bring valuables to the hospital. [facility] is not responsible for any missing/lost belongings or valuables.   Notify your doctor if there is  any change in your medical condition (cold, fever, infection).  If you are being discharged the day of surgery, you will not be allowed to drive home. You will need a responsible individual to drive you home and stay with you for 24 hours after surgery.   If you are taking public transportation, you will need to have a responsible individual with you.  If you are being admitted to the hospital overnight, leave your suitcase in the car. After surgery it may be brought to your room.  In case of increased patient census, it may be necessary for you, the patient, to continue your postoperative care in the Same Day Surgery department.  After surgery, you can help prevent lung complications by doing breathing exercises.  Take deep breaths and cough every 1-2 hours. Your doctor may order a device called an Incentive Spirometer to help you take deep breaths. When coughing or sneezing, hold a pillow firmly against your incision with both hands. This is called "splinting." Doing this helps protect your incision. It also decreases belly discomfort.  Surgery Visitation Policy:  Patients undergoing a surgery or procedure may have two family members or support persons with them as long as the person is not COVID-19 positive or experiencing its symptoms.   Inpatient Visitation:    Visiting hours are 7 a.m. to 8 p.m. Up to four visitors are allowed at one time in a patient room. The visitors may  rotate out with other people during the day. One designated support person (adult) may remain overnight.  Please call the Pre-admissions Testing Dept. at [phone] if you have any questions about these instructions.  ??????? ?? ????? ??????? ???-???????? ??? [phone] ??? ????? ???????????? ????????? ??????? ?? ????? ??? ???????. Kalste to [employer] Pro-eisdochs sto [phone] en chete opoiesdpote erotseis schetik me afts tis odiges.

## 2023-04-03 MED ORDER — CEFAZOLIN SODIUM-DEXTROSE 2-4 GM/100ML-% IV SOLN
2.0000 g | INTRAVENOUS | Status: AC
  Administered 2023-04-04: 2 g via INTRAVENOUS

## 2023-04-03 MED ORDER — SODIUM CHLORIDE 0.9 % IV SOLN: INTRAVENOUS | Status: DC

## 2023-04-03 MED ORDER — ORAL CARE MOUTH RINSE: 15.0000 mL | Freq: Once | OROMUCOSAL | Status: AC

## 2023-04-03 MED ORDER — CHLORHEXIDINE GLUCONATE 0.12 % MT SOLN
15.0000 mL | Freq: Once | OROMUCOSAL | Status: AC
  Administered 2023-04-04: 15 mL via OROMUCOSAL

## 2023-04-04 ENCOUNTER — Ambulatory Visit
Admission: RE | Admit: 2023-04-04 | Discharge: 2023-04-04 | Disposition: A | Discharge status: Home / Self Care | Payer: Medicare Other | Source: Ambulatory Visit | Attending: Urology | Admitting: Urology

## 2023-04-04 ENCOUNTER — Encounter: Payer: Self-pay | Admitting: Urology

## 2023-04-04 ENCOUNTER — Ambulatory Visit: Payer: Medicare Other

## 2023-04-04 ENCOUNTER — Encounter
Admission: RE | Disposition: A | Discharge status: Home / Self Care | Payer: Self-pay | Source: Ambulatory Visit | Attending: Urology

## 2023-04-04 ENCOUNTER — Ambulatory Visit: Payer: Medicare Other | Admitting: Anesthesiology

## 2023-04-04 DIAGNOSIS — K5730 Diverticulosis of large intestine without perforation or abscess without bleeding: Secondary | ICD-10-CM | POA: Diagnosis not present

## 2023-04-04 DIAGNOSIS — N132 Hydronephrosis with renal and ureteral calculous obstruction: Secondary | ICD-10-CM | POA: Diagnosis present

## 2023-04-04 DIAGNOSIS — N179 Acute kidney failure, unspecified: Secondary | ICD-10-CM | POA: Insufficient documentation

## 2023-04-04 DIAGNOSIS — Z8619 Personal history of other infectious and parasitic diseases: Secondary | ICD-10-CM | POA: Insufficient documentation

## 2023-04-04 DIAGNOSIS — N201 Calculus of ureter: Secondary | ICD-10-CM

## 2023-04-04 DIAGNOSIS — E1165 Type 2 diabetes mellitus with hyperglycemia: Secondary | ICD-10-CM

## 2023-04-04 DIAGNOSIS — E876 Hypokalemia: Secondary | ICD-10-CM

## 2023-04-04 HISTORY — PX: CYSTOSCOPY/URETEROSCOPY/HOLMIUM LASER/STENT PLACEMENT: SHX6546

## 2023-04-04 LAB — POCT I-STAT, CHEM 8
BUN: 17 mg/dL (ref 8–23)
Calcium, Ion: 1.1 mmol/L — ABNORMAL LOW (ref 1.15–1.40)
Chloride: 99 mmol/L (ref 98–111)
Creatinine, Ser: 1 mg/dL (ref 0.44–1.00)
Glucose, Bld: 214 mg/dL — ABNORMAL HIGH (ref 70–99)
HCT: 35 % — ABNORMAL LOW (ref 36.0–46.0)
Hemoglobin: 11.9 g/dL — ABNORMAL LOW (ref 12.0–15.0)
Potassium: 3 mmol/L — ABNORMAL LOW (ref 3.5–5.1)
Sodium: 138 mmol/L (ref 135–145)
TCO2: 29 mmol/L (ref 22–32)

## 2023-04-04 LAB — GLUCOSE, CAPILLARY: Glucose-Capillary: 166 mg/dL — ABNORMAL HIGH (ref 70–99)

## 2023-04-04 SURGERY — CYSTOSCOPY/URETEROSCOPY/HOLMIUM LASER/STENT PLACEMENT
Anesthesia: General | Site: Ureter | Laterality: Left

## 2023-04-04 MED ORDER — FENTANYL CITRATE (PF) 100 MCG/2ML IJ SOLN
INTRAMUSCULAR | Status: AC
  Filled 2023-04-04: qty 2

## 2023-04-04 MED ORDER — FENTANYL CITRATE (PF) 100 MCG/2ML IJ SOLN
25.0000 ug | INTRAMUSCULAR | Status: DC | PRN
  Administered 2023-04-04: 25 ug via INTRAVENOUS

## 2023-04-04 MED ORDER — DEXAMETHASONE SODIUM PHOSPHATE 10 MG/ML IJ SOLN
INTRAMUSCULAR | Status: AC
  Filled 2023-04-04: qty 2

## 2023-04-04 MED ORDER — FENTANYL CITRATE (PF) 100 MCG/2ML IJ SOLN
INTRAMUSCULAR | Status: DC | PRN
  Administered 2023-04-04: 25 ug via INTRAVENOUS

## 2023-04-04 MED ORDER — IOHEXOL 180 MG/ML  SOLN
INTRAMUSCULAR | Status: DC | PRN
  Administered 2023-04-04: 10 mL

## 2023-04-04 MED ORDER — ONDANSETRON HCL 4 MG/2ML IJ SOLN
INTRAMUSCULAR | Status: DC | PRN
  Administered 2023-04-04: 4 mg via INTRAVENOUS

## 2023-04-04 MED ORDER — LACTATED RINGERS IV SOLN: INTRAVENOUS | Status: DC | PRN

## 2023-04-04 MED ORDER — LIDOCAINE HCL (CARDIAC) PF 100 MG/5ML IV SOSY
PREFILLED_SYRINGE | INTRAVENOUS | Status: DC | PRN
  Administered 2023-04-04: 60 mg via INTRAVENOUS

## 2023-04-04 MED ORDER — ONDANSETRON HCL 4 MG/2ML IJ SOLN
INTRAMUSCULAR | Status: AC
  Filled 2023-04-04: qty 4

## 2023-04-04 MED ORDER — DEXAMETHASONE SODIUM PHOSPHATE 10 MG/ML IJ SOLN
INTRAMUSCULAR | Status: DC | PRN
  Administered 2023-04-04: 10 mg via INTRAVENOUS

## 2023-04-04 MED ORDER — SODIUM CHLORIDE 0.9 % IR SOLN
Status: DC | PRN
  Administered 2023-04-04: 3000 mL

## 2023-04-04 MED ORDER — PROPOFOL 10 MG/ML IV BOLUS
INTRAVENOUS | Status: DC | PRN
  Administered 2023-04-04: 120 mg via INTRAVENOUS

## 2023-04-04 MED ORDER — ONDANSETRON HCL 4 MG/2ML IJ SOLN: 4.0000 mg | Freq: Once | INTRAMUSCULAR | Status: DC | PRN

## 2023-04-04 MED ORDER — CEFAZOLIN SODIUM-DEXTROSE 2-4 GM/100ML-% IV SOLN
INTRAVENOUS | Status: AC
  Filled 2023-04-04: qty 100

## 2023-04-04 MED ORDER — CHLORHEXIDINE GLUCONATE 0.12 % MT SOLN
OROMUCOSAL | Status: AC
  Filled 2023-04-04: qty 15

## 2023-04-04 SURGICAL SUPPLY — 23 items
ADHESIVE MASTISOL STRL (MISCELLANEOUS) IMPLANT
BAG DRAIN SIEMENS DORNER NS (MISCELLANEOUS) ×1 IMPLANT
BASKET ZERO TIP 1.9FR (BASKET) IMPLANT
BRUSH SCRUB EZ 1% IODOPHOR (MISCELLANEOUS) IMPLANT
CATH URET FLEX-TIP 2 LUMEN 10F (CATHETERS) IMPLANT
CATH URETL OPEN 5X70 (CATHETERS) ×1 IMPLANT
CNTNR URN SCR LID CUP LEK RST (MISCELLANEOUS) IMPLANT
DRAPE UTILITY 15X26 TOWEL STRL (DRAPES) IMPLANT
DRSG TEGADERM 2-3/8X2-3/4 SM (GAUZE/BANDAGES/DRESSINGS) IMPLANT
FIBER LASER MOSES 200 DFL (Laser) IMPLANT
GLOVE BIO SURGEON STRL SZ 6.5 (GLOVE) ×1 IMPLANT
GOWN STRL REUS W/ TWL LRG LVL3 (GOWN DISPOSABLE) ×2 IMPLANT
GUIDEWIRE GREEN .038 145CM (MISCELLANEOUS) IMPLANT
GUIDEWIRE STR DUAL SENSOR (WIRE) ×1 IMPLANT
IV NS IRRIG 3000ML ARTHROMATIC (IV SOLUTION) ×1 IMPLANT
KIT TURNOVER CYSTO (KITS) ×1 IMPLANT
PACK CYSTO AR (MISCELLANEOUS) ×1 IMPLANT
SET CYSTO W/LG BORE CLAMP LF (SET/KITS/TRAYS/PACK) ×1 IMPLANT
SHEATH NAVIGATOR HD 12/14X36 (SHEATH) IMPLANT
STENT URET 6FRX24 CONTOUR (STENTS) IMPLANT
STENT URET 6FRX26 CONTOUR (STENTS) IMPLANT
SURGILUBE 2OZ TUBE FLIPTOP (MISCELLANEOUS) ×1 IMPLANT
WATER STERILE IRR 500ML POUR (IV SOLUTION) ×1 IMPLANT

## 2023-04-04 NOTE — Op Note (Signed)
 Date of procedure: 04/04/23  Preoperative diagnosis:  Left obstructing ureteral calculus History of sepsis of urinary source  Postoperative diagnosis:  Same as above  Procedure: Left ureteroscopy Laser lithotripsy Left ureteral stent exchange Retrograde pyelogram Interpretation of fluoroscopy less than 30 minutes  Surgeon: Rosina Riis, MD  Anesthesia: General  Complications: None  Intraoperative findings: Proximal left obstructing stone retropulsed into collecting system.  Stone obliterated all fragments removed via nitinol basket.  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  Cystocele noted.  EBL: Minimal  Specimens: Stone fragment  Drains: 6 x 24 French double-J ureteral stent on the left with tether  Indication: Kelsey Wise is a 78 y.o. patient with 6 mm left proximal obstructing ureteral calculus status post emergent ureteral stent placement.  She returns today for definitive management of her stone..  After reviewing the management options for treatment, she/her/hers  elected to proceed with the above surgical procedure(s). We have discussed the potential benefits and risks of the procedure, side effects of the proposed treatment, the likelihood of the patient achieving the goals of the procedure, and any potential problems that might occur during the procedure or recuperation. Informed consent has been obtained.  Description of procedure:  The patient was taken to the operating room and general anesthesia was induced.  The patient was placed in the dorsal lithotomy position, prepped and draped in the usual sterile fashion, and preoperative antibiotics were administered. A preoperative time-out was performed.    A 21 French scope was advanced per urethra into the bladder.  Attention was turned to the left ureteral orifice from which a ureteral stent was seen emanating.  The distal coil of the stent was grasped and  brought to level urethral meatus.  This was then cannulated using a sensor wire up to the level of the kidney.  A semirigid ureteroscope was advanced to the proximal ureter where the stone was identified.  Unfortunately, the stone retropulsed into the renal pelvis and 1 is unacceptable with the semirigid.  As such, I advanced a Super Stiff wire up to the level of the kidney as a working wire and then a 12/14 French ureteral access sheath which went easily.  A dual-lumen flexible ureteroscope was then brought in.  The stone was identified in the renal pelvis.  A 200 m laser fiber was then brought in using dusting settings of 0.3 J and 80 Hz, the stone was fragmented.  He did not dust fairly well and as such, 1.9 French tipless nitinol basket was brought in to extract all of the smaller pieces of the stone.  All fragments were removed.  The scope was then advanced into the each of the calyces however could not get into the upper pole on one of the mid pole calyces due to infundibular stenosis.  That being said, on CT scan there were no stones within these calyces.  At the end of the procedure, no significant residual stone fragment remained greater than the size of the tip of the laser fiber.  A final retrograde pyelogram created roadmap to ensure that each every calyx was visualized and all significant stone burden was addressed.  There was no contrast extravasation.  The scope was then backed down the length of the ureter inspecting the ureteral integrity along the way.  There was no residual stone burden or significant ureteral injuries appreciated.  Finally, a 6 by 24 French ureteral access sheath was advanced over the safety wire up  to the level of the kidney.  Upon wire removal, there was a full coil noted both within the renal pelvis as well as within the bladder.  The stent string was left at fixed to the distal coil of the stent and secured to the patient's using Mastisol and Tegaderm.  The patient was then  cleaned and dried, repositioned in supine position, reversed of anesthesia, taken to the PACU in stable condition.  Plan: She should remove her own stent on Monday next week.  Follow-up in 4 to 6 weeks with renal ultrasound prior.  Rosina Riis, M.D.

## 2023-04-04 NOTE — Transfer of Care (Signed)
 Immediate Anesthesia Transfer of Care Note  Patient: Kelsey Wise  Procedure(s) Performed: CYSTOSCOPY/URETEROSCOPY/HOLMIUM LASER/STENT EXCHANGE (Left: Ureter)  Patient Location: PACU  Anesthesia Type:General  Level of Consciousness: awake, alert , and oriented  Airway & Oxygen Therapy: Patient Spontanous Breathing  Post-op Assessment: Report given to RN and Post -op Vital signs reviewed and stable  Post vital signs: Reviewed and stable  Last Vitals:  Vitals Value Taken Time  BP 125/68 04/04/23 1237  Temp    Pulse 64 04/04/23 1237  Resp 17 04/04/23 1237  SpO2 98 % 04/04/23 1237    Last Pain:  Vitals:   04/04/23 1237  TempSrc:   PainSc: 0-No pain      Patients Stated Pain Goal: 0 (04/04/23 1031)  Complications: No notable events documented.

## 2023-04-04 NOTE — Discharge Instructions (Signed)
 You have a ureteral stent in place.  This is a tube that extends from your kidney to your bladder.  This may cause urinary bleeding, burning with urination, and urinary frequency.  Please call our office or present to the ED if you develop fevers >101 or pain which is not able to be controlled with oral pain medications.  You may be given either Flomax and/ or ditropan to help with bladder spasms and stent pain in addition to pain medications.    Please leave your stent in place until Monday, 04/11/2023.  On the day, untape the string and pull gently until the entire stent is removed.  If its pulled out prematurely, this is not an emergency but try your best to avoid this.  [facility] [address] [place], [place] [address] [phone]

## 2023-04-04 NOTE — Anesthesia Postprocedure Evaluation (Signed)
 Anesthesia Post Note  Patient: Kelsey Wise  Procedure(s) Performed: CYSTOSCOPY/URETEROSCOPY/HOLMIUM LASER/STENT EXCHANGE (Left: Ureter)  Patient location during evaluation: PACU Anesthesia Type: General Level of consciousness: awake and alert Pain management: pain level controlled Vital Signs Assessment: post-procedure vital signs reviewed and stable Respiratory status: spontaneous breathing, nonlabored ventilation, respiratory function stable and patient connected to nasal cannula oxygen Cardiovascular status: blood pressure returned to baseline and stable Postop Assessment: no apparent nausea or vomiting Anesthetic complications: no   No notable events documented.   Last Vitals:  Vitals:   04/04/23 1330 04/04/23 1343  BP: (!) 124/55 (!) 144/63  Pulse: 74 78  Resp: 11 15  Temp: 36.8 C (!) 36.4 C  SpO2: 97% 94%    Last Pain:  Vitals:   04/04/23 1343  TempSrc: Temporal  PainSc: 2                  Prentice Murphy

## 2023-04-04 NOTE — Anesthesia Procedure Notes (Signed)
 Procedure Name: LMA Insertion Date/Time: 04/04/2023 11:56 AM  Performed by: Erie Chock, CRNAPre-anesthesia Checklist: Patient identified, Patient being monitored, Timeout performed, Emergency Drugs available and Suction available Patient Re-evaluated:Patient Re-evaluated prior to induction Oxygen Delivery Method: Circle system utilized Preoxygenation: Pre-oxygenation with 100% oxygen Induction Type: IV induction Ventilation: Mask ventilation without difficulty LMA: LMA inserted LMA Size: 4.0 Tube type: Oral Number of attempts: 1 Placement Confirmation: positive ETCO2 and breath sounds checked- equal and bilateral Tube secured with: Tape Dental Injury: Teeth and Oropharynx as per pre-operative assessment

## 2023-04-04 NOTE — Interval H&P Note (Signed)
 History and Physical Interval Note:  04/04/2023 1:02 PM  Kelsey Wise  has presented today for surgery, with the diagnosis of Left Ureteral Stone.  The various methods of treatment have been discussed with the patient and family. After consideration of risks, benefits and other options for treatment, the patient has consented to  Procedure(s): CYSTOSCOPY/URETEROSCOPY/HOLMIUM LASER/STENT EXCHANGE (Left) as a surgical intervention.  The patient's history has been reviewed, patient examined, no change in status, stable for surgery.  I have reviewed the patient's chart and labs.  Questions were answered to the patient's satisfaction.    Returns today for staged procedure to address stone.  She has been treated appropriately with culture specific antibiotics. Regular rate and rhythm Clear to auscultation bilaterally  Consent obtained today with Greek translator via green machine   [employer]

## 2023-04-04 NOTE — Anesthesia Preprocedure Evaluation (Signed)
 Anesthesia Evaluation  Patient identified by MRN, date of birth, ID band Patient awake    Reviewed: Allergy & Precautions, H&P , NPO status , Patient's Chart, lab work & pertinent test results  History of Anesthesia Complications Negative for: history of anesthetic complications  Airway Mallampati: II  TM Distance: >3 FB Neck ROM: full    Dental  (+) Edentulous Upper, Edentulous Lower, Dental Advidsory Given   Pulmonary neg pulmonary ROS  tachypnea         Cardiovascular hypertension, (-) angina (-) Past MI and (-) Cardiac Stents Normal cardiovascular exam(-) dysrhythmias (-) Valvular Problems/Murmurs  EKG : Sinus tachycardia Right atrial enlargement Minimal voltage criteria for LVH, may be normal variant ( R in aVL ) Anterolateral infarct , age undetermined Abnormal ECG No previous ECGs available   Neuro/Psych neg Seizures  Neuromuscular disease (Numbness and tingling of right upper extremity, RUE weakness)  negative psych ROS   GI/Hepatic negative GI ROS, Neg liver ROS,,,  Endo/Other  diabetes, Type 2Hypothyroidism    Renal/GU Renal InsufficiencyRenal diseaseSEPTIC STONE     Musculoskeletal   Abdominal  (+) + obese  Peds  Hematology  (+) Blood dyscrasia, anemia thrombocytopenia   Anesthesia Other Findings Past Medical History: 02/2023: AKI (acute kidney injury) (HCC)     Comment:  left-sided hydroureteronephrosis. 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 02/2023: Severe sepsis (HCC)     Comment:  Bacteremia due to Proteus species No date: Venous insufficiency   Reproductive/Obstetrics negative OB ROS                             Anesthesia Physical Anesthesia Plan  ASA: 3  Anesthesia Plan: General   Post-op  Pain Management: Ofirmev  IV (intra-op)*   Induction: Intravenous  PONV Risk Score and Plan: 2 and Ondansetron  and Dexamethasone   Airway Management Planned: LMA and Oral ETT  Additional Equipment:   Intra-op Plan:   Post-operative Plan: Extubation in OR  Informed Consent: I have reviewed the patients History and Physical, chart, labs and discussed the procedure including the risks, benefits and alternatives for the proposed anesthesia with the patient or authorized representative who has indicated his/her understanding and acceptance.     Dental Advisory Given  Plan Discussed with: CRNA and Surgeon  Anesthesia Plan Comments:         Anesthesia Quick Evaluation

## 2023-04-05 ENCOUNTER — Encounter: Payer: Self-pay | Admitting: Urology

## 2023-04-06 ENCOUNTER — Other Ambulatory Visit: Payer: Self-pay

## 2023-04-06 DIAGNOSIS — N201 Calculus of ureter: Secondary | ICD-10-CM

## 2023-04-12 ENCOUNTER — Other Ambulatory Visit: Payer: Medicare Other

## 2023-04-14 ENCOUNTER — Ambulatory Visit: Payer: Medicare Other | Admitting: Obstetrics

## 2023-04-21 ENCOUNTER — Encounter: Payer: Self-pay | Admitting: Obstetrics and Gynecology

## 2023-04-21 ENCOUNTER — Ambulatory Visit: Payer: Medicare Other | Admitting: Obstetrics and Gynecology

## 2023-04-21 ENCOUNTER — Other Ambulatory Visit (HOSPITAL_COMMUNITY)
Admission: RE | Admit: 2023-04-21 | Discharge: 2023-04-21 | Disposition: A | Discharge status: Home / Self Care | Payer: Medicare Other | Source: Other Acute Inpatient Hospital | Attending: Obstetrics and Gynecology | Admitting: Obstetrics and Gynecology

## 2023-04-21 VITALS — BP 117/61 | HR 79 | Ht <= 58 in | Wt 188.7 lb

## 2023-04-21 DIAGNOSIS — R319 Hematuria, unspecified: Secondary | ICD-10-CM

## 2023-04-21 DIAGNOSIS — N952 Postmenopausal atrophic vaginitis: Secondary | ICD-10-CM

## 2023-04-21 DIAGNOSIS — R82998 Other abnormal findings in urine: Secondary | ICD-10-CM

## 2023-04-21 DIAGNOSIS — R350 Frequency of micturition: Secondary | ICD-10-CM | POA: Diagnosis not present

## 2023-04-21 DIAGNOSIS — N8110 Cystocele, unspecified: Secondary | ICD-10-CM

## 2023-04-21 LAB — CALCULI, WITH PHOTOGRAPH (CLINICAL LAB)
Calcium Oxalate Dihydrate: 30 %
Calcium Oxalate Monohydrate: 70 %
Weight Calculi: 23 mg

## 2023-04-21 LAB — POCT URINALYSIS DIPSTICK
Bilirubin, UA: NEGATIVE
Glucose, UA: POSITIVE — AB
Ketones, UA: NEGATIVE
Nitrite, UA: NEGATIVE
Protein, UA: POSITIVE — AB
Spec Grav, UA: 1.015 (ref 1.010–1.025)
Urobilinogen, UA: 1 U/dL
pH, UA: 8.5 — AB (ref 5.0–8.0)

## 2023-04-21 LAB — URINALYSIS, ROUTINE W REFLEX MICROSCOPIC
Bilirubin Urine: NEGATIVE
Glucose, UA: >=500 mg/dL — AB
Ketones, ur: NEGATIVE mg/dL
Nitrite: NEGATIVE
Protein, ur: 30 mg/dL — AB
Specific Gravity, Urine: 1.022 (ref 1.005–1.030)
WBC, UA: >50 WBC/hpf (ref 0–5)
pH: 8 (ref 5.0–8.0)

## 2023-04-21 MED ORDER — ESTRADIOL 0.1 MG/GM VA CREA: 0.5000 g | TOPICAL_CREAM | VAGINAL | 11 refills | Status: DC

## 2023-04-21 NOTE — Progress Notes (Signed)
[facility] New Patient Evaluation and Consultation  Referring Provider: Allie Bossier, MD PCP: Hillery Aldo, MD Date of Service: 04/21/2023  Due to language barrier, an interpreter was present during the history-taking and subsequent discussion (and for part of the physical exam) with this patient.    SUBJECTIVE Chief Complaint: New Patient (Initial Visit) Kelsey Wise is a 78 y.o. female is here for prolapse.)  History of Present Illness: Kelsey Wise is a 78 y.o. [place] female seen in consultation at the request of Dr. Marice Potter for evaluation of prolapse.    Review of records significant for:  Noted from Dr. Marice Potter: 02/21/23 I placed the new #3 ring with support in her vagina. It held her uterus up and did not cause her any discomfort initially. However, after she dressed and I left the room, she called me back because she was feeling it "low". I removed it.  01/31/23 Complete procedentia noted, moderate VVA Bimanual exam reveals no masses or pain I fitted tried several pessaries and eventually found that a #3 ring with support relieves her prolapse and does not cause her pain/discomfort.  CT Abdomen Pelvis WO Contrast (03/14/23)  1. 6 x 5 mm obstructing upper left ureteral calculus with associated hydroureteronephrosis. 2. No other urinary tract calculi. 3. Sigmoid colon diverticulosis. 4. Mildly distended gallbladder but no calcified gallstones or pericholecystic inflammatory changes or fluid. 5. Very small bilateral pleural effusions and bibasilar atelectasis  Urinary Symptoms: Does not leak urine.    Day time voids 3.  Nocturia: 1 times per night to void. Voiding dysfunction:  does not empty bladder well.  Patient does not use a catheter to empty bladder.  When urinating, patient feels a weak stream Drinks: 40-60oz Water and Chamomile tea per day  UTIs: 1 UTI's in the last year.   Reports history of blood in urine and kidney or bladder  stones Susceptibility data from last 90 days. Collected Specimen Info Organism AMPICILLIN AMPICILLIN/SULBACTAM CEFAZOLIN CEFEPIME Ceftazidime CEFTRIAXONE Ciprofloxacin Gentamicin Susc lslt Imipenem Nitrofurantoin Susc lslt Piperacillin + Tazobactam  03/13/23 Urine, Random Proteus mirabilis  S  S  S  S   S  S  S  S  R  S  03/13/23 Blood from BLOOD RIGHT ARM Proteus mirabilis (ZZ00)    S            Proteus mirabilis (ZZ01)  S  S   S  S  S  S  S  S   S   Collected Specimen Info Organism Trimethoprim/Sulfa  03/13/23 Urine, Random Proteus mirabilis  R  03/13/23 Blood from BLOOD RIGHT ARM Proteus mirabilis (ZZ00)     Proteus mirabilis (ZZ01)  S    Pelvic Organ Prolapse Symptoms:                  Patient Admits to a feeling of a bulge the vaginal area. It has been present for 6 months.  Patient Admits to seeing a bulge.  This bulge is bothersome.  Bowel Symptom: Bowel movements: 3-4 time(s) per week Stool consistency: hard Straining: yes.  Splinting: no.  Incomplete evacuation: no.  Patient Denies accidental bowel leakage / fecal incontinence Bowel regimen: none Last colonoscopy: Never   Sexual Function Sexually active: no.  Sexual orientation: Straight Pain with sex: No  Pelvic Pain Denies pelvic pain    Past Medical History:  Past Medical History:  Diagnosis Date   AKI (acute kidney injury) (HCC) 02/2023   left-sided hydroureteronephrosis.  Arthritis    Carpal tunnel syndrome    Diabetes mellitus without complication (HCC)    HLD (hyperlipidemia)    Hypertension    Hypothyroidism    Myocardial injury    Severe sepsis (HCC) 02/2023   Bacteremia due to Proteus species   Venous insufficiency      Past Surgical History:   Past Surgical History:  Procedure Laterality Date   CYSTOSCOPY W/ URETERAL STENT PLACEMENT  03/14/2023   Procedure: CYSTOSCOPY WITH RETROGRADE PYELOGRAM/URETERAL STENT PLACEMENT;  Surgeon: Vanna Scotland, MD;  Location: [facility] ORS;  Service:  Urology;;   CYSTOSCOPY/URETEROSCOPY/HOLMIUM LASER/STENT PLACEMENT Left 04/04/2023   Procedure: CYSTOSCOPY/URETEROSCOPY/HOLMIUM LASER/STENT EXCHANGE;  Surgeon: Vanna Scotland, MD;  Location: [facility] OR[facility];  Service: Urology;  Laterality: Left;   uterian tumor removal per patient       Past OB/GYN History: G3 P3 G3P3003 Vaginal deliveries: 3,  Forceps/ Vacuum deliveries: 0, Cesarean section: 0 Menopausal: Yes, at age 26 Last pap smear unknown HM PAP   This patient has no relevant Health Maintenance data.     Medications: Patient has a current medication list which includes the following prescription(s): acetaminophen, aspirin, atorvastatin, vitamin d3 super strength, cyanocobalamin, duloxetine, empagliflozin, estradiol, gabapentin, garlic, levothyroxine, loratadine, metformin, multivitamin with minerals, olopatadine, and triamterene-hydrochlorothiazide.   Allergies: Patient has no known allergies.   Social History:  Social History   Tobacco Use   Smoking status: Never   Smokeless tobacco: Never  Vaping Use   Vaping status: Never Used  Substance Use Topics   Alcohol use: Not Currently   Drug use: Not Currently    Relationship status: married Patient lives with husband.   Patient is not employed . Regular exercise: No History of abuse: No  Family History:   Family History  Problem Relation Age of Onset   Arthritis Mother    Heart attack Mother    Stroke Father    Diabetes Sister    Hypertension Son    Hyperlipidemia Son    Non-Hodgkin's lymphoma Son    Breast cancer Neg Hx      Review of Systems: Review of Systems  Constitutional:  Positive for malaise/fatigue. Negative for chills and fever.       +Weight Gain  Respiratory:  Positive for shortness of breath. Negative for cough.   Cardiovascular:  Negative for chest pain and palpitations.  Gastrointestinal:  Negative for abdominal pain, blood in stool, constipation and diarrhea.  Genitourinary:  Positive for dysuria.   Skin:  Negative for rash.  Neurological:  Negative for weakness.  Psychiatric/Behavioral:  Positive for depression. Negative for suicidal ideas.      OBJECTIVE Physical Exam: Vitals:   04/21/23 0936  BP: 117/61  Pulse: 79  Weight: 188 lb 11.2 oz (85.6 kg)  Height: 4' 9.3" (1.455 m)    Physical Exam Vitals reviewed. Exam conducted with a chaperone present.  Constitutional:      Appearance: Normal appearance.  Pulmonary:     Effort: Pulmonary effort is normal.  Abdominal:     Palpations: Abdomen is soft.  Skin:    General: Skin is warm and dry.  Neurological:     General: No focal deficit present.     Mental Status: She is alert and oriented to person, place, and time.  Psychiatric:        Mood and Affect: Mood normal.        Behavior: Behavior normal. Behavior is cooperative.        Thought Content: Thought content normal.  GU / Detailed Urogynecologic Evaluation:  Pelvic Exam: Patient has blood on vulvar lips from reported scratching due to nerves; Bartholin's and Skene's glands normal in appearance; urethral meatus normal in appearance, no urethral masses or discharge.   CST: negative  Speculum exam reveals normal vaginal mucosa with atrophy. Cervix very thin and difficult to identify on exam with present prolapse and atrophy. Uterus normal single, nontender. Adnexa normal adnexa.    With apex supported, anterior compartment defect was present  Pelvic floor strength I/V  Pelvic floor musculature: Right levator tender, Right obturator tender, Left levator tender, Left obturator tender  POP-Q:   POP-Q  2                                            Aa   2                                           Ba  2.5                                              C   5                                            Gh  4.5                                            Pb  6.5                                            tvl   1                                             Ap  1                                            Bp  0                                              D      Rectal Exam:  Normal external exam, but large amount of stool noted at the rectum externally.   Post-Void Residual (PVR) by Bladder Scan: In order to evaluate bladder emptying, we discussed obtaining a postvoid residual and patient agreed to this procedure.  Procedure: The ultrasound unit was placed on the patient's abdomen in the suprapubic region after the patient had voided.  Laboratory Results: Lab Results  Component Value Date   COLORU yellow 04/21/2023   CLARITYU cloudy 04/21/2023   GLUCOSEUR Positive (A) 04/21/2023   BILIRUBINUR negative 04/21/2023   KETONESU negative 04/21/2023   SPECGRAV 1.015 04/21/2023   RBCUR Large 04/21/2023   PHUR 8.5 (A) 04/21/2023   PROTEINUR Positive (A) 04/21/2023   UROBILINOGEN 1.0 04/21/2023   LEUKOCYTESUR Small (1+) (A) 04/21/2023    Lab Results  Component Value Date   CREATININE 1.00 04/04/2023   CREATININE 1.16 (H) 03/17/2023   CREATININE 1.10 (H) 03/16/2023    Lab Results  Component Value Date   HGBA1C 7.5 (H) 03/13/2023    Lab Results  Component Value Date   HGB 11.9 (L) 04/04/2023     ASSESSMENT AND PLAN Ms. Needle is a 78 y.o. with:  1. Pelvic organ prolapse quantification stage 3 cystocele   2. Vaginal atrophy   3. Urinary frequency   4. Leukocytes in urine   5. Hematuria, unspecified type    Patient's pelvic organ prolapse was reviewed with patient and son with interpretor assistance. POP-Q virtual tool was used to discuss deficits. We discussed that with the bladder being pulled down it can make it harder to urinate and that the pessary can help with this. Patient was fitted with a #4 short stem Gellhorn. Patient was able to urinate and attempt a bowel movement without pessary expulsion. I asked patient if she wanted to discuss surgical intervention and she reported she want to avoid surgery if at  all possible. We discussed that she would need to come once every 3 months for a pessary check to which she and her son were agreeable. Patient has to come from [place] area but her son reports it should not be a problem to bring her every few months.  Patient has vaginal atrophy on exam. She would benefit from estrogen cream. Patient to use a blueberry sized amount into the vagina. She may use this nightly for 2 weeks and then twice weekly after. We discussed using her finger instead of using the applicator.  Urine is concerning for possible UTI. Will send urine for culture to rule out UTI. Patient has recently had a cystoscopy  and ureteroscopy with stent placement on 04/04/23 so she may have some irritation from prior procedure, or she may have a new onset UTI.  Urine sent for culture. Patient also had large blood in her urine. Will send for micro today.    Patient to return in 4-6 weeks for pessary check or sooner if needed.   Selmer Dominion, NP

## 2023-04-21 NOTE — Patient Instructions (Addendum)
You were fit with a Short Stem Gelhorn pessary today. This will help hold up the prolapse and help you pee easier as well as hopefully have easier bowel movements.   For your vaginal atrophy, I would like you to use estrogen cream nightly for the next 2 weeks and then twice a week after.   Please call if the pessary is uncomfortable or you feel things are not working well for you.

## 2023-04-22 ENCOUNTER — Other Ambulatory Visit: Payer: Self-pay | Admitting: Obstetrics and Gynecology

## 2023-04-22 ENCOUNTER — Encounter: Payer: Self-pay | Admitting: Obstetrics and Gynecology

## 2023-04-22 MED ORDER — SULFAMETHOXAZOLE-TRIMETHOPRIM 800-160 MG PO TABS: 1.0000 | ORAL_TABLET | Freq: Two times a day (BID) | ORAL | 0 refills | Status: AC

## 2023-04-22 MED ORDER — ESTRADIOL 0.1 MG/GM VA CREA: 0.5000 g | TOPICAL_CREAM | VAGINAL | 11 refills | Status: AC

## 2023-04-22 NOTE — Progress Notes (Signed)
Please call and inform patient she has a UTI. Will send in Bactrim to cover while we await formal culture results. Sent to [facility] as [facility] may not get medication in time for patient for weekend.

## 2023-04-23 LAB — URINE CULTURE: Culture: >=100000 — AB

## 2023-05-06 ENCOUNTER — Ambulatory Visit
Admission: RE | Admit: 2023-05-06 | Discharge: 2023-05-06 | Disposition: A | Discharge status: Home / Self Care | Payer: Medicare Other | Source: Ambulatory Visit | Attending: Urology | Admitting: Urology

## 2023-05-06 DIAGNOSIS — N201 Calculus of ureter: Secondary | ICD-10-CM | POA: Diagnosis present

## 2023-05-17 ENCOUNTER — Ambulatory Visit: Payer: Self-pay | Admitting: Urology

## 2023-06-03 ENCOUNTER — Ambulatory Visit: Payer: Medicare Other | Admitting: Obstetrics and Gynecology

## 2023-06-03 ENCOUNTER — Encounter: Payer: Self-pay | Admitting: Obstetrics and Gynecology

## 2023-06-03 VITALS — BP 120/76 | HR 85

## 2023-06-03 DIAGNOSIS — N3281 Overactive bladder: Secondary | ICD-10-CM | POA: Diagnosis not present

## 2023-06-03 DIAGNOSIS — N813 Complete uterovaginal prolapse: Secondary | ICD-10-CM | POA: Diagnosis not present

## 2023-06-03 DIAGNOSIS — R350 Frequency of micturition: Secondary | ICD-10-CM

## 2023-06-03 DIAGNOSIS — N952 Postmenopausal atrophic vaginitis: Secondary | ICD-10-CM

## 2023-06-03 DIAGNOSIS — N8110 Cystocele, unspecified: Secondary | ICD-10-CM

## 2023-06-03 LAB — POCT URINALYSIS DIPSTICK
Bilirubin, UA: NEGATIVE
Glucose, UA: POSITIVE — AB
Ketones, UA: NEGATIVE
Nitrite, UA: NEGATIVE
Protein, UA: NEGATIVE
Spec Grav, UA: 1.01 (ref 1.010–1.025)
Urobilinogen, UA: 0.2 U/dL
pH, UA: 6 (ref 5.0–8.0)

## 2023-06-03 MED ORDER — MIRABEGRON ER 25 MG PO TB24: 25.0000 mg | ORAL_TABLET | Freq: Every day | ORAL | 5 refills | Status: DC

## 2023-06-03 NOTE — Progress Notes (Signed)
 [facility]   Subjective:     Chief Complaint:  Chief Complaint  Patient presents with   Follow-up    Kelsey Wise is a 78 y.o. female is here for pessary check visit.   History of Present Illness: Kelsey Wise is a 78 y.o. female with stage III pelvic organ prolapse who presents for a pessary check. She is using a size #4 short stem gellhorn pessary. The pessary has been working well and she has no complaints. She is using vaginal estrogen. She denies vaginal bleeding.  Past Medical History: Patient  has a past medical history of AKI (acute kidney injury) (HCC) (02/2023), Arthritis, Carpal tunnel syndrome, Diabetes mellitus without complication (HCC), HLD (hyperlipidemia), Hypertension, Hypothyroidism, Myocardial injury, Severe sepsis (HCC) (02/2023), and Venous insufficiency.   Past Surgical History: She  has a past surgical history that includes uterian tumor removal per patient; Cystoscopy w/ ureteral stent placement (03/14/2023); and Cystoscopy/ureteroscopy/holmium laser/stent placement (Left, 04/04/2023).   Medications: She has a current medication list which includes the following prescription(s): acetaminophen, aspirin, atorvastatin, vitamin d3 super strength, cyanocobalamin, duloxetine, empagliflozin, estradiol, gabapentin, garlic, levothyroxine, loratadine, metformin, mirabegron er, multivitamin with minerals, olopatadine, and triamterene-hydrochlorothiazide.   Allergies: Patient has no known allergies.   Social History: Patient  reports that she has never smoked. She has never used smokeless tobacco. She reports that she does not currently use alcohol. She reports that she does not currently use drugs.      Objective:    Physical Exam: BP 120/76   Pulse 85  Gen: No apparent distress, A&O x 3. Detailed Urogynecologic Evaluation:  Pelvic Exam: Normal external female genitalia; Bartholin's and Skene's glands normal in appearance; urethral  meatus normal in appearance, no urethral masses or discharge. The pessary was noted to be in place but was painful to remove as her Gh had decreased and she had some bleeding at the posterior fourchette. It was removed and cleaned. Speculum exam revealed no lesions in the vagina. The pessary was changed to a #3 Short Stem Gellhorn. It was comfortable to the patient and fit well. She was able to urinate without pessary expulsion.     Laboratory Results: Urine dipstick shows: negative for all components, positive for trace leukocytes.  Lab Results  Component Value Date   COLORU yellow 06/03/2023   CLARITYU clear 06/03/2023   GLUCOSEUR Positive (A) 06/03/2023   BILIRUBINUR negative 06/03/2023   KETONESU negative 06/03/2023   SPECGRAV 1.010 06/03/2023   RBCUR Small 06/03/2023   PHUR 6.0 06/03/2023   PROTEINUR Negative 06/03/2023   UROBILINOGEN 0.2 06/03/2023   LEUKOCYTESUR Trace (A) 06/03/2023       Assessment/Plan:    Assessment: Ms. Moxley is a 78 y.o. with stage III pelvic organ prolapse here for a pessary check. She is doing well.  Plan: She will keep the pessary in place until next visit. She will continue to use estrogen. She will follow-up in 3 months for a pessary check or sooner as needed.   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 Will start patient on Myrbetriq 25mg  daily. She has leakage and frequent loss of urine on her clothing. Unsure of medication coverage as she gets her medication through [facility]. Would not recommend anticholinergics based on age and risk for brain fog.  All questions were answered.

## 2023-06-09 ENCOUNTER — Ambulatory Visit: Payer: Medicare Other | Admitting: Urology

## 2023-07-19 ENCOUNTER — Ambulatory Visit (INDEPENDENT_AMBULATORY_CARE_PROVIDER_SITE_OTHER): Admitting: Urology

## 2023-07-19 VITALS — BP 120/74 | HR 105 | Ht 64.0 in | Wt 188.2 lb

## 2023-07-19 DIAGNOSIS — N1330 Unspecified hydronephrosis: Secondary | ICD-10-CM

## 2023-07-19 NOTE — Progress Notes (Signed)
 Kelsey Wise,acting as a scribe for Dustin Gimenez, MD.,have documented all relevant documentation on the behalf of Dustin Gimenez, MD,as directed by  Dustin Gimenez, MD while in the presence of Dustin Gimenez, MD.  07/19/23 4:37 PM   Kelsey Wise [birth year] [id number]  Referring provider: Comer Decamp, MD [address] [place],  [place] [address]  Chief Complaint  Patient presents with   Follow-up    HPI:  78 year old female with a personal history of left-sided obstructing ureteral stone complicated by sepsis, status post emergent ureteral stent placement and definitive stone removal via basket extraction on 04-04-2023, presents for follow-up.   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 She is following up today after a renal ultrasound performed postoperatively. The ultrasound was interpreted as showing mild hydronephrosis, but on personal review, the provider did not appreciate significant hydronephrosis, only some fluid pockets possibly representing synodic infundibular and dilated calyces.   She reports persistent pain in the left lower extremity, which worsens during urination and resolves after voiding. She denies hematuria in the urine but notes seeing blood when drying off after bathing, possibly from the vaginal area. She denies dysuria but endorses pain with urination. She recently saw OB-GYN for pessary fitting, but the pessary was not the right fit and was removed. She had a urinalysis at the GYN office, which was negative for infection. She is concerned about the cause of her persistent pain and the mild swelling of the left kidney.  PMH: Past Medical History:  Diagnosis Date   AKI (acute kidney injury) (HCC) 02/2023   left-sided hydroureteronephrosis.   Arthritis    Carpal tunnel syndrome    Diabetes mellitus without complication (HCC)    HLD (hyperlipidemia)     Hypertension    Hypothyroidism    Myocardial injury    Severe sepsis (HCC) 02/2023   Bacteremia due to Proteus species   Venous insufficiency     Surgical History: Past Surgical History:  Procedure Laterality Date   CYSTOSCOPY W/ URETERAL STENT PLACEMENT  03/14/2023   Procedure: CYSTOSCOPY WITH RETROGRADE PYELOGRAM/URETERAL STENT PLACEMENT;  Surgeon: Dustin Gimenez, MD;  Location: [facility] ORS;  Service: Urology;;   CYSTOSCOPY/URETEROSCOPY/HOLMIUM LASER/STENT PLACEMENT Left 04/04/2023   Procedure: CYSTOSCOPY/URETEROSCOPY/HOLMIUM LASER/STENT EXCHANGE;  Surgeon: Dustin Gimenez, MD;  Location: [facility] OR[facility];  Service: Urology;  Laterality: Left;   uterian tumor removal per patient      Home Medications:  Allergies as of 07/19/2023   No Known Allergies      Medication List        Accurate as of July 19, 2023  4:37 PM. If you have any questions, ask your nurse or doctor.          acetaminophen  325 MG tablet Commonly known as: TYLENOL  Take 2 tablets (650 mg total) by mouth every 6 (six) hours as needed.   aspirin  81 MG chewable tablet Chew 81 mg by mouth in the morning.   atorvastatin  80 MG tablet Commonly known as: LIPITOR Take 80 mg by mouth in the morning.   cyanocobalamin 1000 MCG tablet Commonly known as: VITAMIN B12 Take 1,000 mcg by mouth in the morning.   DULoxetine 60 MG capsule Commonly known as: CYMBALTA Take 60 mg by mouth in the morning.   estradiol  0.1 MG/GM vaginal cream Commonly known as: ESTRACE  Place 0.5 g vaginally 2 (two) times a week. Place 0.5g nightly for two weeks then twice  a week after   gabapentin  600 MG tablet Commonly known as: NEURONTIN  Take 600 mg by mouth 2 (two) times daily.   Garlic 1000 MG Caps Take 1,000 mg by mouth in the morning.   Jardiance 10 MG Tabs tablet Generic drug: empagliflozin Take 10 mg by mouth in the morning.   levothyroxine  50 MCG tablet Commonly known as: SYNTHROID  Take 50 mcg by mouth daily before  breakfast.   loratadine 10 MG tablet Commonly known as: CLARITIN Take 10 mg by mouth in the morning.   metFORMIN 500 MG 24 hr tablet Commonly known as: GLUCOPHAGE-XR Take 1,000 mg by mouth daily with breakfast.   mirabegron  ER 25 MG Tb24 tablet Commonly known as: MYRBETRIQ  Take 1 tablet (25 mg total) by mouth daily.   multivitamin with minerals Tabs tablet Take 1 tablet by mouth in the morning. One A Day for Women 50+   olopatadine  0.1 % ophthalmic solution Commonly known as: PATANOL Place 1 drop into both eyes daily as needed (allergy/eye pain/irritation.).   triamterene-hydrochlorothiazide 37.5-25 MG tablet Commonly known as: MAXZIDE-25 Take 1 tablet by mouth in the morning.   Vitamin D3 Super Strength 50 MCG (2000 UT) tablet Generic drug: Cholecalciferol Take 2,000 Units by mouth in the morning.        Family History: Family History  Problem Relation Age of Onset   Arthritis Mother    Heart attack Mother    Stroke Father    Diabetes Sister    Hypertension Son    Hyperlipidemia Son    Non-Hodgkin's lymphoma Son    Breast cancer Neg Hx     Social History:  reports that she has never smoked. She has never used smokeless tobacco. She reports that she does not currently use alcohol. She reports that she does not currently use drugs.   Physical Exam: BP 120/74   Pulse (!) 105   Ht 5\' 4"  (1.626 m)   Wt 188 lb 4 oz (85.4 kg)   BMI 32.31 kg/m   Constitutional:  Alert and oriented, No acute distress. HEENT: [place] AT, moist mucus membranes.  Trachea midline, no masses. Neurologic: Grossly intact, no focal deficits, moving all 4 extremities. Psychiatric: Normal mood and affect.   Pertinent Imaging: PROCEDURE: US  RENAL   HISTORY: Patient is a 78 y/o  F with left kidney stone.   COMPARISON: CT AP 03/14/2023.   TECHNIQUE: Two-dimensional grayscale and color Doppler ultrasound of the kidneys was performed.   FINDINGS: The urinary bladder demonstrates normal  anechoic echogenicity. The bilateral ureteral jets are not visualized.   The right kidney measures 9.9 x 6.1 x 5.2 cm. Renal cortical echotexture is within normal limits. There is no hydronephrosis. There are no stones. There are multiple parapelvic cysts with the largest measuring 1.1 cm at the inferior pole.   The left kidney measures 9.6 x 5.5 x 4.5 cm. Renal cortical echotexture is within normal limits. There is mild hydronephrosis. There are no stones. There are no cysts.   IMPRESSION: 1. Mild left hydronephrosis. No sonographic evidence of nephrolithiasis.   2.  Right renal cysts.   Thank you for allowing us  to assist in the care of this patient.     Electronically Signed   By: Beula Brunswick M.D.   On: 05/06/2023 17:33  This was personally reviewed. The ultrasound was interpreted as showing mild hydronephrosis, but on personal review, I did not appreciate significant hydronephrosis, only some fluid pockets possibly representing synodic infundibular and dilated calyces. Unfortunately, they  did not assess for ureteral jets.   Assessment & Plan:    1. History of left-sided obstructing ureteral stone, s/p ureteral stent and stone removal, with persistent mild left hydronephrosis - Differential includes: post-surgical edema, chronic hydronephrosis due to anatomic changes (infundibular stenosis, friable mucosa), residual or recurrent stone, ureteral stricture, or less likely, new obstruction. - No evidence of infection or residual stone on imaging or urinalysis. Hydronephrosis may be chronic and related to prior anatomic changes. - Repeat renal ultrasound to assess for stability or resolution of hydronephrosis. If stable or improved, no further intervention required. If worsened, further evaluation for obstruction or stricture will be necessary. - Counsel patient on importance of follow-up to prevent potential loss of renal function if obstruction recurs. - We discussed general  stone prevention techniques including drinking plenty water with goal of producing 2.5 L urine daily, increased citric acid intake, avoidance of high oxalate containing foods, and decreased salt intake.  Information about dietary recommendations given today.    2. Intermittent perineal pain and blood noted when wiping, post-void - Differential includes: post-surgical irritation, vaginal atrophy, pessary-related trauma, or less likely, lower urinary tract pathology. - No hematuria on urinalysis, no dysuria, and pain is not clearly renal in origin. - She was recently evaluated by OB-GYN for pessary fitting; may be related to vaginal irritation or pessary size. - Continue to monitor symptoms. If pain or bleeding worsens, consider repeat pelvic exam and urinalysis. Encourage her to follow up with OB-GYN as needed for pessary management.   Return in about 3 months (around 10/18/2023) for repeat renal ultrasound with PA follow up.  Kennard Pea  [facility] [address] [place], [place] [address] [phone]

## 2023-07-19 NOTE — Patient Instructions (Signed)
 Kelsey Wise

## 2023-08-01 ENCOUNTER — Ambulatory Visit
Admission: RE | Admit: 2023-08-01 | Discharge: 2023-08-01 | Disposition: A | Discharge status: Home / Self Care | Source: Ambulatory Visit | Attending: Urology | Admitting: Urology

## 2023-08-01 DIAGNOSIS — N1330 Unspecified hydronephrosis: Secondary | ICD-10-CM

## 2023-08-16 ENCOUNTER — Ambulatory Visit (INDEPENDENT_AMBULATORY_CARE_PROVIDER_SITE_OTHER): Admitting: Physician Assistant

## 2023-08-16 VITALS — BP 121/76 | HR 82 | Ht 64.0 in | Wt 186.0 lb

## 2023-08-16 DIAGNOSIS — N1330 Unspecified hydronephrosis: Secondary | ICD-10-CM

## 2023-08-16 NOTE — Progress Notes (Signed)
 08/16/2023 3:37 PM   Kelsey Wise April 21, 1945 [id number]  CC: Chief Complaint  Patient presents with   Hydronephrosis   HPI: Kelsey Wise is a 78 y.o. female with PMH left ureteral stone with sepsis s/p stent and basket extraction in January who was subsequently noted to have left hydronephrosis versus dilated calyces who presents today for follow-up.   Today she reports no acute concerns.  She has occasional dysuria, though none today.  Repeat renal ultrasound on 08/01/2023 showed no hydronephrosis.  PMH: Past Medical History:  Diagnosis Date   AKI (acute kidney injury) (HCC) 02/2023   left-sided hydroureteronephrosis.   Arthritis    Carpal tunnel syndrome    Diabetes mellitus without complication (HCC)    HLD (hyperlipidemia)    Hypertension    Hypothyroidism    Myocardial injury    Severe sepsis (HCC) 02/2023   Bacteremia due to Proteus species   Venous insufficiency     Surgical History: Past Surgical History:  Procedure Laterality Date   CYSTOSCOPY W/ URETERAL STENT PLACEMENT  03/14/2023   Procedure: CYSTOSCOPY WITH RETROGRADE PYELOGRAM/URETERAL STENT PLACEMENT;  Surgeon: Dustin Gimenez, MD;  Location: [facility] OR[facility];  Service: Urology;;   CYSTOSCOPY/URETEROSCOPY/HOLMIUM LASER/STENT PLACEMENT Left 04/04/2023   Procedure: CYSTOSCOPY/URETEROSCOPY/HOLMIUM LASER/STENT EXCHANGE;  Surgeon: Dustin Gimenez, MD;  Location: [facility] OR[facility];  Service: Urology;  Laterality: Left;   uterian tumor removal per patient      Home Medications:  Allergies as of 08/16/2023   No Known Allergies      Medication List        Accurate as of Aug 16, 2023  3:37 PM. If you have any questions, ask your nurse or doctor.          acetaminophen  325 MG tablet Commonly known as: TYLENOL  Take 2 tablets (650 mg total) by mouth every 6 (six) hours as needed.   aspirin  81 MG chewable tablet Chew 81 mg by mouth in the morning.   atorvastatin  80 MG tablet Commonly known as:  LIPITOR Take 80 mg by mouth in the morning.   cyanocobalamin 1000 MCG tablet Commonly known as: VITAMIN B12 Take 1,000 mcg by mouth in the morning.   DULoxetine 60 MG capsule Commonly known as: CYMBALTA Take 60 mg by mouth in the morning.   estradiol  0.1 MG/GM vaginal cream Commonly known as: ESTRACE  Place 0.5 g vaginally 2 (two) times a week. Place 0.5g nightly for two weeks then twice a week after   gabapentin  600 MG tablet Commonly known as: NEURONTIN  Take 600 mg by mouth 2 (two) times daily.   Garlic 1000 MG Caps Take 1,000 mg by mouth in the morning.   Jardiance 10 MG Tabs tablet Generic drug: empagliflozin Take 10 mg by mouth in the morning.   levothyroxine  50 MCG tablet Commonly known as: SYNTHROID  Take 50 mcg by mouth daily before breakfast.   loratadine 10 MG tablet Commonly known as: CLARITIN Take 10 mg by mouth in the morning.   metFORMIN 500 MG 24 hr tablet Commonly known as: GLUCOPHAGE-XR Take 1,000 mg by mouth daily with breakfast.   mirabegron  ER 25 MG Tb24 tablet Commonly known as: MYRBETRIQ  Take 1 tablet (25 mg total) by mouth daily.   multivitamin with minerals Tabs tablet Take 1 tablet by mouth in the morning. One A Day for Women 50+   olopatadine  0.1 % ophthalmic solution Commonly known as: PATANOL Place 1 drop into both eyes daily as needed (allergy/eye pain/irritation.).   triamterene-hydrochlorothiazide 37.5-25 MG tablet Commonly  known as: MAXZIDE-25 Take 1 tablet by mouth in the morning.   Vitamin D3 Super Strength 50 MCG (2000 UT) tablet Generic drug: Cholecalciferol Take 2,000 Units by mouth in the morning.        Allergies:  No Known Allergies  Family History: Family History  Problem Relation Age of Onset   Arthritis Mother    Heart attack Mother    Stroke Father    Diabetes Sister    Hypertension Son    Hyperlipidemia Son    Non-Hodgkin's lymphoma Son    Breast cancer Neg Hx     Social History:   reports that  she has never smoked. She has never used smokeless tobacco. She reports that she does not currently use alcohol. She reports that she does not currently use drugs.  Physical Exam: BP 121/76   Pulse 82   Ht 5\' 4"  (1.626 m)   Wt 186 lb (84.4 kg)   BMI 31.93 kg/m   Constitutional:  Alert and oriented, no acute distress, nontoxic appearing HEENT: Kelsey Wise, AT Cardiovascular: No clubbing, cyanosis, or edema Respiratory: Normal respiratory effort, no increased work of breathing Skin: No rashes, bruises or suspicious lesions Neurologic: Grossly intact, no focal deficits, moving all 4 extremities Psychiatric: Normal mood and affect  Pertinent Imaging: Results for orders placed during the hospital encounter of 08/01/23  Ultrasound renal complete  Narrative CLINICAL DATA:  Left hydronephrosis.  EXAM: RENAL / URINARY TRACT ULTRASOUND COMPLETE  COMPARISON:  May 06, 2023  FINDINGS: Right Kidney:  Renal measurements: 9.6 x 4.9 x 5.1 cm = volume: 126 mL. Echogenicity within normal limits. No hydronephrosis visualized. Right peripelvic renal cysts are identified, largest measures 1.5 x 1 x 1.3 cm. No follow-up is recommended.  Left Kidney:  Renal measurements: 9.6 x 5 x 4.4 cm = volume: 108 mL. Echogenicity within normal limits. No mass or hydronephrosis visualized.  Bladder:  Appears normal for degree of bladder distention.  Other:  None.  IMPRESSION: No acute abnormality identified. No hydronephrosis bilaterally.   Electronically Signed By: Anna Barnes M.D. On: 08/08/2023 09:32  I personally reviewed the images referenced above and note no hydronephrosis.  Assessment & Plan:   1. Hydronephrosis of left kidney (Primary) Resolved on follow-up ultrasound.  No further monitoring indicated.  I offered her annual surveillance with [facility] versus follow-up as needed and she prefers the latter, which is reasonable.  Return if symptoms worsen or fail to improve.  Kathreen Pare, PA-C  [facility] [place] [address] [place], [place] [address] [phone]

## 2023-09-02 ENCOUNTER — Ambulatory Visit: Admitting: Obstetrics and Gynecology

## 2023-09-02 ENCOUNTER — Encounter: Payer: Self-pay | Admitting: Obstetrics and Gynecology

## 2023-09-02 VITALS — BP 101/67 | HR 81

## 2023-09-02 DIAGNOSIS — B372 Candidiasis of skin and nail: Secondary | ICD-10-CM

## 2023-09-02 DIAGNOSIS — N8110 Cystocele, unspecified: Secondary | ICD-10-CM

## 2023-09-02 DIAGNOSIS — N813 Complete uterovaginal prolapse: Secondary | ICD-10-CM | POA: Diagnosis not present

## 2023-09-02 DIAGNOSIS — N3281 Overactive bladder: Secondary | ICD-10-CM | POA: Diagnosis not present

## 2023-09-02 DIAGNOSIS — N952 Postmenopausal atrophic vaginitis: Secondary | ICD-10-CM

## 2023-09-02 MED ORDER — MIRABEGRON ER 50 MG PO TB24: 50.0000 mg | ORAL_TABLET | Freq: Every day | ORAL | 5 refills | Status: AC

## 2023-09-02 MED ORDER — NYSTATIN 100000 UNIT/GM EX POWD: 1.0000 | Freq: Three times a day (TID) | CUTANEOUS | 0 refills | Status: AC

## 2023-09-02 NOTE — Patient Instructions (Signed)
 For the overactive bladder please use the Myrbetriq  50mg  I have sent to [facility].   Continue to use the estrogen cream x2 weekly.   You have a yeast infection under your stomach. I am sending in a powder for you to use to help dry this up.

## 2023-09-02 NOTE — Progress Notes (Signed)
 [facility]   Subjective:      Chief Complaint:  Chief Complaint  Patient presents with   Follow-up    Kelsey Wise is a 78 y.o. female  for pessary check   History of Present Illness: Kelsey Wise is a 78 y.o. female with stage III pelvic organ prolapse and OAB who presents for a pessary check. She is using a size #3 short stem gellhorn pessary. The pessary has been working well and she has no complaints. She is using vaginal estrogen. She denies vaginal bleeding.  Past Medical History: Patient  has a past medical history of AKI (acute kidney injury) (HCC) (02/2023), Arthritis, Carpal tunnel syndrome, Diabetes mellitus without complication (HCC), HLD (hyperlipidemia), Hypertension, Hypothyroidism, Myocardial injury, Severe sepsis (HCC) (02/2023), and Venous insufficiency.   Past Surgical History: She  has a past surgical history that includes uterian tumor removal per patient; Cystoscopy w/ ureteral stent placement (03/14/2023); and Cystoscopy/ureteroscopy/holmium laser/stent placement (Left, 04/04/2023).   Medications: She has a current medication list which includes the following prescription(s): acetaminophen , aspirin , atorvastatin , vitamin d3 super strength, cyanocobalamin, duloxetine, empagliflozin, estradiol , gabapentin , garlic, levothyroxine , loratadine, metformin, mirabegron  er, multivitamin with minerals, nystatin, olopatadine , and triamterene-hydrochlorothiazide.   Allergies: Patient has no known allergies.   Social History: Patient  reports that she has never smoked. She has never used smokeless tobacco. She reports that she does not currently use alcohol. She reports that she does not currently use drugs.      Objective:     Physical Exam: BP 101/67   Pulse 81  Gen: No apparent distress, A&O x 3. Detailed Urogynecologic Evaluation:  Pelvic Exam: Normal external female genitalia; Bartholin's and Skene's glands normal in appearance;  urethral meatus normal in appearance, no urethral masses or discharge. The pessary was noted to be in place. It was removed and cleaned. Speculum exam revealed no lesions in the vagina. The pessary was replaced and she had some minimal bleeding at the posterior fourchette with replacement. It was comfortable to the patient and fit well.     Assessment/Plan:    Assessment: [name] is a 78 y.o. with stage III pelvic organ prolapse and OAB here for a pessary check. She is doing well.  Plan: She will keep the pessary in place until next visit. She will continue to use estrogen. She will follow-up in 3 months for a pessary check or sooner as needed.   Patient had a skin yeast infection under her stomach, Nystatin powder sent to pharmacy. She works in her garden a lot and gets the sweat in the panis region.   Patient reports her Myrbetriq  was working well but she had not gotten a refill yet. Will increase Myrbetriq  to 50mg  and send in new prescription to [facility] for OAB management.   All questions were answered.

## 2023-11-11 ENCOUNTER — Other Ambulatory Visit (HOSPITAL_COMMUNITY)
Admission: RE | Admit: 2023-11-11 | Discharge: 2023-11-11 | Disposition: A | Discharge status: Home / Self Care | Source: Ambulatory Visit | Attending: Obstetrics and Gynecology | Admitting: Obstetrics and Gynecology

## 2023-11-11 ENCOUNTER — Ambulatory Visit (INDEPENDENT_AMBULATORY_CARE_PROVIDER_SITE_OTHER): Admitting: Obstetrics and Gynecology

## 2023-11-11 VITALS — BP 113/64 | HR 77

## 2023-11-11 DIAGNOSIS — R319 Hematuria, unspecified: Secondary | ICD-10-CM

## 2023-11-11 DIAGNOSIS — R82998 Other abnormal findings in urine: Secondary | ICD-10-CM

## 2023-11-11 DIAGNOSIS — N9089 Other specified noninflammatory disorders of vulva and perineum: Secondary | ICD-10-CM | POA: Insufficient documentation

## 2023-11-11 DIAGNOSIS — N390 Urinary tract infection, site not specified: Secondary | ICD-10-CM | POA: Diagnosis not present

## 2023-11-11 DIAGNOSIS — N3281 Overactive bladder: Secondary | ICD-10-CM | POA: Diagnosis not present

## 2023-11-11 DIAGNOSIS — N813 Complete uterovaginal prolapse: Secondary | ICD-10-CM | POA: Diagnosis not present

## 2023-11-11 DIAGNOSIS — R350 Frequency of micturition: Secondary | ICD-10-CM

## 2023-11-11 LAB — URINALYSIS, COMPLETE (UACMP) WITH MICROSCOPIC
Bilirubin Urine: NEGATIVE
Bilirubin Urine: NEGATIVE
Glucose, UA: >=500 mg/dL — AB
Glucose, UA: NEGATIVE mg/dL
Ketones, ur: NEGATIVE mg/dL
Ketones, ur: NEGATIVE mg/dL
Nitrite: NEGATIVE
Nitrite: NEGATIVE
Protein, ur: 100 mg/dL — AB
Protein, ur: 30 mg/dL — AB
Specific Gravity, Urine: 1.024 (ref 1.005–1.030)
Specific Gravity, Urine: 1.01 (ref 1.005–1.030)
WBC, UA: >50 WBC/hpf (ref 0–5)
WBC, UA: >50 WBC/hpf (ref 0–5)
pH: 7 (ref 5.0–8.0)
pH: 7 (ref 5.0–8.0)

## 2023-11-11 LAB — POCT URINALYSIS DIP (CLINITEK)
Bilirubin, UA: NEGATIVE
Glucose, UA: NEGATIVE mg/dL
Ketones, POC UA: NEGATIVE mg/dL
Nitrite, UA: NEGATIVE
POC PROTEIN,UA: 100 — AB
Spec Grav, UA: 1.015 (ref 1.010–1.025)
Urobilinogen, UA: 1 U/dL
pH, UA: 7 (ref 5.0–8.0)

## 2023-11-11 MED ORDER — SULFAMETHOXAZOLE-TRIMETHOPRIM 800-160 MG PO TABS: 1.0000 | ORAL_TABLET | Freq: Two times a day (BID) | ORAL | 0 refills | Status: AC

## 2023-11-11 NOTE — Progress Notes (Signed)
 [facility]   Subjective:     Chief Complaint:  Chief Complaint  Patient presents with   Follow-up    Kelsey Wise is a 78 y.o. female is here due to her pessary hurting   History of Present Illness: Kelsey Wise is a 78 y.o. female with stage III pelvic organ prolapse who presents for a pessary check. She is using a size #3 short stem gellhorn pessary. The pessary has been working well and she has no complaints. She is using vaginal estrogen. She denies vaginal bleeding. Reports burning with urination today.   Patient also reports she is unsure of wanting to continue pessary long term.  Past Medical History: Patient  has a past medical history of AKI (acute kidney injury) (HCC) (02/2023), Arthritis, Carpal tunnel syndrome, Diabetes mellitus without complication (HCC), HLD (hyperlipidemia), Hypertension, Hypothyroidism, Myocardial injury, Severe sepsis (HCC) (02/2023), and Venous insufficiency.   Past Surgical History: She  has a past surgical history that includes uterian tumor removal per patient; Cystoscopy w/ ureteral stent placement (03/14/2023); and Cystoscopy/ureteroscopy/holmium laser/stent placement (Left, 04/04/2023).   Medications: She has a current medication list which includes the following prescription(s): acetaminophen , aspirin , atorvastatin , vitamin d3 super strength, cyanocobalamin, duloxetine, empagliflozin, estradiol , gabapentin , garlic, levothyroxine , loratadine, metformin, mirabegron  er, multivitamin with minerals, nystatin , olopatadine , sulfamethoxazole -trimethoprim , and triamterene-hydrochlorothiazide.   Allergies: Patient has no known allergies.   Social History: Patient  reports that she has never smoked. She has never used smokeless tobacco. She reports that she does not currently use alcohol. She reports that she does not currently use drugs.      Objective:    Physical Exam: BP 113/64   Pulse 77  Gen: No apparent  distress, A&O x 3. Detailed Urogynecologic Evaluation:  Pelvic Exam: Normal external female genitalia; Bartholin's and Skene's glands normal in appearance; urethral meatus normal in appearance, no urethral masses or discharge. The pessary was noted to be in place. It was removed and cleaned. Speculum exam revealed no lesions in the vagina. The pessary was replaced. It was comfortable to the patient and fit well, she did have a small amount of bleeding at the posterior fourchette.   Laboratory Results: . Lab Results  Component Value Date   COLORU yellow 11/11/2023   CLARITYU clear 11/11/2023   GLUCOSEUR negative 11/11/2023   BILIRUBINUR negative 11/11/2023   KETONESU negative 06/03/2023   SPECGRAV 1.015 11/11/2023   RBCUR small (A) 11/11/2023   PHUR 7.0 11/11/2023   PROTEINUR Negative 06/03/2023   UROBILINOGEN 1.0 11/11/2023   LEUKOCYTESUR Small (1+) (A) 11/11/2023     Assessment/Plan:    Assessment: Kelsey Wise is a 78 y.o. with stage III pelvic organ prolapse here for a pessary check. She is doing well.  Plan: She will keep the pessary in place until next visit. She will continue to use estrogen. She will follow-up in 3 months for a pessary check or sooner as needed.   We briefly discussed surgical intervention including colpocleisis. Patient is unsure if she wants surgery but reports she felt today was not as painful as previous pessary changes. She will plan to continue pessary for now. Handouts given for colpocleisis and a picture of the POP-Q that correlates with her level of prolapse.   She also reports the OAB medication, Myrbetriq , has been helpful in reducing her OAB ssymptoms.   Will treat urine as UTI today. Bactrim  sent in for patient as we are going into the weekend. Will send urine for culture.   All  questions were answered.

## 2023-11-13 ENCOUNTER — Other Ambulatory Visit: Payer: Self-pay | Admitting: Obstetrics and Gynecology

## 2023-11-13 ENCOUNTER — Ambulatory Visit: Payer: Self-pay | Admitting: Obstetrics and Gynecology

## 2023-11-13 DIAGNOSIS — B3731 Acute candidiasis of vulva and vagina: Secondary | ICD-10-CM

## 2023-11-13 LAB — URINE CULTURE: Culture: >=100000 — AB

## 2023-11-13 MED ORDER — AMPICILLIN 500 MG PO CAPS: 500.0000 mg | ORAL_CAPSULE | Freq: Four times a day (QID) | ORAL | 0 refills | Status: DC

## 2023-11-13 NOTE — Progress Notes (Signed)
 Previous antibiotic prescribed, UTI not sensitive. Ampicillin  sensitive, will send for treatment. No hx of penicillin allergy.

## 2023-11-13 NOTE — Progress Notes (Signed)
 Please inform patient that her UTI is not sensitive to the antibiotic. Will send in Ampicillin 

## 2023-11-15 LAB — CERVICOVAGINAL ANCILLARY ONLY
Bacterial Vaginitis (gardnerella): NEGATIVE
Candida Glabrata: NEGATIVE
Candida Vaginitis: POSITIVE — AB
Comment: NEGATIVE
Comment: NEGATIVE
Comment: NEGATIVE

## 2023-11-15 MED ORDER — FLUCONAZOLE 150 MG PO TABS
150.0000 mg | ORAL_TABLET | Freq: Once | ORAL | 0 refills | Status: AC
Start: 2023-11-15 — End: 2023-11-15

## 2023-11-15 NOTE — Progress Notes (Signed)
#  2 LVMTRC.

## 2023-12-09 ENCOUNTER — Ambulatory Visit: Admitting: Obstetrics and Gynecology

## 2024-02-10 ENCOUNTER — Ambulatory Visit: Admitting: Obstetrics and Gynecology

## 2024-02-10 ENCOUNTER — Encounter: Payer: Self-pay | Admitting: Obstetrics and Gynecology

## 2024-02-10 VITALS — BP 112/77 | HR 83 | Ht 64.0 in | Wt 186.0 lb

## 2024-02-10 DIAGNOSIS — N8110 Cystocele, unspecified: Secondary | ICD-10-CM

## 2024-02-10 DIAGNOSIS — Z960 Presence of urogenital implants: Secondary | ICD-10-CM | POA: Diagnosis not present

## 2024-02-10 DIAGNOSIS — N3281 Overactive bladder: Secondary | ICD-10-CM

## 2024-02-10 DIAGNOSIS — N813 Complete uterovaginal prolapse: Secondary | ICD-10-CM

## 2024-02-10 NOTE — Progress Notes (Signed)
 [facility]   Subjective:     Chief Complaint:  Chief Complaint  Patient presents with   Pessary Check   History of Present Illness: Kelsey Wise is a 78 y.o. female with stage III pelvic organ prolapse who presents for a pessary check. She is using a size #3 short stem gellhorn pessary. The pessary has been working well and she has no complaints. She is using vaginal estrogen. She denies vaginal bleeding.   Past Medical History: Patient  has a past medical history of AKI (acute kidney injury) (02/2023), Arthritis, Carpal tunnel syndrome, Diabetes mellitus without complication (HCC), HLD (hyperlipidemia), Hypertension, Hypothyroidism, Myocardial injury, Severe sepsis (HCC) (02/2023), and Venous insufficiency.   Past Surgical History: She  has a past surgical history that includes uterian tumor removal per patient; Cystoscopy w/ ureteral stent placement (03/14/2023); and Cystoscopy/ureteroscopy/holmium laser/stent placement (Left, 04/04/2023).   Medications: She has a current medication list which includes the following prescription(s): ampicillin , aspirin , atorvastatin , vitamin d3 super strength, cyanocobalamin, duloxetine, empagliflozin, estradiol , gabapentin , garlic, levothyroxine , loratadine, metformin, mirabegron  er, multivitamin with minerals, nystatin , olopatadine , triamterene-hydrochlorothiazide, and tylenol .   Allergies: Patient has no known allergies.   Social History: Patient  reports that she has never smoked. She has never used smokeless tobacco. She reports that she does not currently use alcohol. She reports that she does not currently use drugs.      Objective:    Physical Exam: BP 112/77   Pulse 83   Ht 5' 4 (1.626 m)   Wt 186 lb (84.4 kg)   BMI 31.93 kg/m  Gen: No apparent distress, A&O x 3. Detailed Urogynecologic Evaluation:  Pelvic Exam: Normal external female genitalia; Bartholin's and Skene's glands normal in appearance; urethral  meatus normal in appearance, no urethral masses or discharge. The pessary was noted to be in place. It was removed and cleaned. Speculum exam revealed no lesions in the vagina. The pessary was replaced. It was comfortable to the patient and fit well, she did have a small amount of bleeding at the posterior fourchette.     Assessment/Plan:    Assessment: Ms. Kelsey Wise is a 78 y.o. with stage III pelvic organ prolapse here for a pessary check. She is doing well.  Plan: She will keep the pessary in place until next visit. She will continue to use estrogen. She will follow-up in 3 months for a pessary check or sooner as needed.   All questions were answered.

## 2024-03-20 ENCOUNTER — Ambulatory Visit

## 2024-03-20 VITALS — BP 98/62 | HR 86 | Ht 61.0 in | Wt 185.0 lb

## 2024-03-20 DIAGNOSIS — I479 Paroxysmal tachycardia, unspecified: Secondary | ICD-10-CM

## 2024-03-20 DIAGNOSIS — R0609 Other forms of dyspnea: Secondary | ICD-10-CM | POA: Insufficient documentation

## 2024-03-20 DIAGNOSIS — R002 Palpitations: Secondary | ICD-10-CM | POA: Diagnosis not present

## 2024-03-20 DIAGNOSIS — R079 Chest pain, unspecified: Secondary | ICD-10-CM | POA: Diagnosis not present

## 2024-03-20 DIAGNOSIS — E782 Mixed hyperlipidemia: Secondary | ICD-10-CM | POA: Diagnosis not present

## 2024-03-20 NOTE — Addendum Note (Signed)
 Addended by: HARL HERON DEL on: 03/20/2024 01:41 PM   Modules accepted: Orders

## 2024-03-20 NOTE — Patient Instructions (Addendum)
 Medication Instructions:  Your physician recommends that you continue on your current medications as directed. Please refer to the Current Medication list given to you today.  *If you need a refill on your cardiac medications before your next appointment, please call your pharmacy*  Lab Work: Your provider would like for you to have following labs drawn today Thyroid  Panel.     Testing/Procedures:   Your physician has requested that you have an echocardiogram. Echocardiography is a painless test that uses sound waves to create images of your heart. It provides your doctor with information about the size and shape of your heart and how well your hearts chambers and valves are working.   You may receive an ultrasound enhancing agent through an IV if needed to better visualize your heart during the echo. This procedure takes approximately one hour.  There are no restrictions for this procedure.  This will take place at [address] [facility], [place] [address]  Please note: We ask at that you not bring children with you during ultrasound (echo/ vascular) testing. Due to room size and safety concerns, children are not allowed in the ultrasound rooms during exams. Our front office staff cannot provide observation of children in our lobby area while testing is being conducted. An adult accompanying a patient to their appointment will only be allowed in the ultrasound room at the discretion of the ultrasound technician under special circumstances. We apologize for any inconvenience.    ZIO XT- Long Term Monitor Instructions  Your physician has requested you wear a ZIO patch monitor for 14 days.  This is a single patch monitor. [employer] supplies one patch monitor per enrollment. Additional stickers are not available. Please do not apply patch if you will be having a Nuclear Stress Test, Echocardiogram, Cardiac CT, MRI, or Chest Xray during the period you would be wearing the  monitor. The patch cannot be worn during these tests. You cannot remove and re-apply the ZIO XT patch monitor.  Your ZIO patch monitor will be mailed 3 day USPS to your address on file. It may take 3-5 days to receive your monitor after you have been enrolled. Once you have received your monitor, please review the enclosed instructions. Your monitor has already been registered assigning a specific monitor serial number to you.  Billing and Patient Assistance Program Information  We have supplied [employer] with any of your insurance information on file for billing purposes.  [employer] offers a sliding scale Patient Assistance Program for patients that do not have insurance, or whose insurance does not completely cover the cost of the ZIO monitor.  You must apply for the Patient Assistance Program to qualify for this discounted rate.  To apply, please call [employer] at [phone], select option 4, select option 2, ask to apply for Patient Assistance Program. Meredeth will ask your household income, and how many people are in your household. They will quote your out-of-pocket cost based on that information. [employer] will also be able to set up a 4-month, interest-free payment plan if needed.  Applying the monitor   Shave hair from upper left chest.  Hold abrader disc by orange tab. Rub abrader in 40 strokes over the upper left chest as indicated in your monitor instructions.  Clean area with 4 enclosed alcohol pads. Let dry.  Apply patch as indicated in monitor instructions. Patch will be placed under collarbone on left side of chest with arrow pointing upward.  Rub patch adhesive wings for 2  minutes. Remove white label marked 1. Remove the white label marked 2. Rub patch adhesive wings for 2 additional minutes.  While looking in a mirror, press and release button in center of patch. A small green light will flash 3-4 times. This will be your only indicator that the monitor has been turned on.  Do not  shower for the first 24 hours. You may shower after the first 24 hours.  Press the button if you feel a symptom. You will hear a small click. Record Date, Time and Symptom in the Patient Logbook.  When you are ready to remove the patch, follow instructions on the last 2 pages of Patient Logbook.  Stick patch monitor into the tabs at the bottom of the return box.  Place Patient Logbook in the blue and white box. Use locking tab on box and tape box closed securely. The blue and white box has prepaid postage on it. Please place it in the mailbox as soon as possible. Your physician should have your test results approximately 7-14 days after the monitor has been mailed back to [facility].  Call [facility] Customer Care at [phone] if you have questions regarding your ZIO XT patch monitor.  Call them immediately if you see an orange light blinking on your monitor.  If your monitor falls off in less than 4 days, contact our Monitor department at [phone].  If your monitor becomes loose or falls off after 4 days call [employer] at [phone] for suggestions on securing your monitor.       Please report to Radiology at the [facility] Main Entrance 30 minutes early for your test.  [address] [place], [place] [address]                         OR   Please report to Radiology at [facility] Main Entrance, medical mall, 30 mins prior to your test.  [address]  [place], [place]  How to Prepare for Your Cardiac PET/CT Stress Test:  Nothing to eat or drink, except water, 3 hours prior to arrival time.  NO caffeine/decaffeinated products, or chocolate 12 hours prior to arrival. (Please note decaffeinated beverages (teas/coffees) still contain caffeine).  If you have caffeine within 12 hours prior, the test will need to be rescheduled.  Medication instructions:  HOLD Lisinopril-hydrochlorothiazide until after procedure   Diabetic  Preparation: If able to eat breakfast prior to 3 hour fasting, you may take METFORMIN  including your insulin . If you do not eat prior to 3 hour fast-Hold all diabetes (oral and insulin ) medications. Patients who wear a continuous glucose monitor MUST remove the device prior to scanning.  You may take your remaining medications with water.  NO perfume, cologne or lotion on chest or abdomen area. FEMALES - Please avoid wearing dresses to this appointment.  Total time is 1 to 2 hours; you may want to bring reading material for the waiting time.  In preparation for your appointment, medication and supplies will be purchased.  Appointment availability is limited, so if you need to cancel or reschedule, please call the Radiology Department Scheduler at [phone] 24 hours in advance to avoid a cancellation fee of $100.00  What to Expect When you Arrive:  Once you arrive and check in for your appointment, you will be taken to a preparation room within the Radiology Department.  A technologist or Nurse will obtain your medical history, verify that  you are correctly prepped for the exam, and explain the procedure.  Afterwards, an IV will be started in your arm and electrodes will be placed on your skin for EKG monitoring during the stress portion of the exam. Then you will be escorted to the PET/CT scanner.  There, staff will get you positioned on the scanner and obtain a blood pressure and EKG.  During the exam, you will continue to be connected to the EKG and blood pressure machines.  A small, safe amount of a radioactive tracer will be injected in your IV to obtain a series of pictures of your heart along with an injection of a stress agent.    After your Exam:  It is recommended that you eat a meal and drink a caffeinated beverage to counter act any effects of the stress agent.  Drink plenty of fluids for the remainder of the day and urinate frequently for the first couple of hours after the exam.   Your doctor will inform you of your test results within 7-10 business days.  For more information and frequently asked questions, please visit our website: [email or web address]  For questions about your test or how to prepare for your test, please call: Cardiac Imaging Nurse Navigators Office: [phone]     Follow-Up: At [facility], you and your health needs are our priority.  As part of our continuing mission to provide you with exceptional heart care, our providers are all part of one team.  This team includes your primary Cardiologist (physician) and Advanced Practice Providers or APPs (Physician Assistants and Nurse Practitioners) who all work together to provide you with the care you need, when you need it.  Your next appointment:  3 month(s)  Provider:  Caron Poser, MD

## 2024-03-20 NOTE — Progress Notes (Signed)
" °  Cardiology Office Note   Date:  03/20/2024  ID:  Kelsey Wise, DOB [birth year], MRN [id number] PCP: Kelsey Domino, MD  [facility] Providers Cardiologist:  Kelsey Poser, MD     History of Present Illness Kelsey Wise is a 78 y.o. female PMH DM2, HTN, CKD 3a, HLD who presents for further evaluation management of dyspnea on exertion.  Seen by PCP for this issue 03/12/2024.  No prior cardiac workup on record.  Last LDL 40 02/2023.  Patient presents with her son today.  They report a several month history of variety of complaints.  She reports dyspnea on exertion, exertional chest discomfort, and palpitations.  Denies any syncope.  Denies any orthopnea.  Intermittent lower extremity edema.  Relevant CVD History -None   ROS: Pt denies any jaw pain, arm pain, syncope, presyncope, orthopnea, PND.  Studies Reviewed I have independently reviewed the patient's ECG, previous medical records, previous blood work.  Physical Exam VS:  There were no vitals taken for this visit.       Wt Readings from Last 3 Encounters:  02/10/24 186 lb (84.4 kg)  08/16/23 186 lb (84.4 kg)  07/19/23 188 lb 4 oz (85.4 kg)    GEN: No acute distress. NECK: No JVD; No carotid bruits. CARDIAC: RRR, no murmurs, rubs, gallops. RESPIRATORY:  Clear to auscultation. EXTREMITIES:  Warm and well-perfused. No edema.  ASSESSMENT AND PLAN Chest discomfort DOE Patient presents with dyspnea on exertion and chest discomfort which is exertional.  Both of these issues remain undifferentiated.  Given her age and comorbidity profile, further workup is indicated.  Plan: - Stress PET to evaluate for evidence of obstructive CAD - Echocardiogram to evaluate for heart failure or structural etiologies - Further plans pending results  HLD Last LDL 40 02/2023.  Well-controlled.  Continue Lipitor 80 mg daily.  Palpitations Paroxysmal tachycardia Patient reports undifferentiated palpitations.  No  syncope.  Plan: - Check thyroid  panel - Zio monitor to evaluate for arrhythmia     Informed Consent   The risks [chest pain, shortness of breath, cardiac arrhythmias, dizziness, blood pressure fluctuations, myocardial infarction, stroke/transient ischemic attack, nausea, vomiting, allergic reaction, radiation exposure, metallic taste sensation and life-threatening complications (estimated to be 1 in 10,000)], benefits (risk stratification, diagnosing coronary artery disease, treatment guidance) and alternatives of a cardiac PET stress test were discussed in detail with Kelsey Wise and she agrees to proceed.     Dispo: RTC 3 months or sooner as needed  Signed, Kelsey Poser, MD  "

## 2024-03-21 ENCOUNTER — Ambulatory Visit: Payer: Self-pay

## 2024-03-21 LAB — THYROID PANEL WITH TSH
Free Thyroxine Index: 2.2 (ref 1.2–4.9)
T3 Uptake Ratio: 26 % (ref 24–39)
T4, Total: 8.6 ug/dL (ref 4.5–12.0)
TSH: 1.26 u[IU]/mL (ref 0.450–4.500)

## 2024-04-14 DIAGNOSIS — R0609 Other forms of dyspnea: Secondary | ICD-10-CM

## 2024-04-14 DIAGNOSIS — R002 Palpitations: Secondary | ICD-10-CM | POA: Diagnosis not present

## 2024-04-14 DIAGNOSIS — R079 Chest pain, unspecified: Secondary | ICD-10-CM

## 2024-04-14 DIAGNOSIS — I479 Paroxysmal tachycardia, unspecified: Secondary | ICD-10-CM

## 2024-04-16 ENCOUNTER — Other Ambulatory Visit: Payer: Self-pay

## 2024-04-16 DIAGNOSIS — E782 Mixed hyperlipidemia: Secondary | ICD-10-CM

## 2024-04-16 DIAGNOSIS — I479 Paroxysmal tachycardia, unspecified: Secondary | ICD-10-CM

## 2024-04-16 DIAGNOSIS — R002 Palpitations: Secondary | ICD-10-CM

## 2024-04-16 DIAGNOSIS — R0609 Other forms of dyspnea: Secondary | ICD-10-CM

## 2024-04-16 DIAGNOSIS — R079 Chest pain, unspecified: Secondary | ICD-10-CM

## 2024-04-18 NOTE — Telephone Encounter (Signed)
 Call and left voicemail via interpreter. Will try again tomorrow on 1/22.

## 2024-04-19 ENCOUNTER — Ambulatory Visit: Admission: RE | Admit: 2024-04-19 | Source: Ambulatory Visit

## 2024-04-20 ENCOUNTER — Ambulatory Visit

## 2024-04-20 DIAGNOSIS — R002 Palpitations: Secondary | ICD-10-CM | POA: Diagnosis present

## 2024-04-20 DIAGNOSIS — R0609 Other forms of dyspnea: Secondary | ICD-10-CM | POA: Diagnosis present

## 2024-04-20 LAB — ECHOCARDIOGRAM COMPLETE
AR max vel: 1.8 cm2
AV Area VTI: 1.97 cm2
AV Area mean vel: 1.79 cm2
AV Mean grad: 4 mmHg
AV Peak grad: 6.6 mmHg
Ao pk vel: 1.28 m/s
Area-P 1/2: 3.46 cm2
S' Lateral: 2.7 cm

## 2024-05-01 ENCOUNTER — Encounter: Payer: Self-pay | Admitting: *Deleted

## 2024-05-10 ENCOUNTER — Ambulatory Visit

## 2024-05-11 ENCOUNTER — Ambulatory Visit: Admitting: Obstetrics and Gynecology

## 2024-06-18 ENCOUNTER — Ambulatory Visit
